# Patient Record
Sex: Male | Born: 1995 | Race: White | Hispanic: No | Marital: Single | Smoking: Never smoker
Health system: Southern US, Community
[De-identification: ages and names within clinical notes are randomized; demographics above are authoritative.]

## PROBLEM LIST (undated history)

## (undated) DIAGNOSIS — R142 Eructation: Secondary | ICD-10-CM

## (undated) DIAGNOSIS — Z789 Other specified health status: Secondary | ICD-10-CM

## (undated) DIAGNOSIS — K219 Gastro-esophageal reflux disease without esophagitis: Secondary | ICD-10-CM

---
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
## 2015-03-13 ENCOUNTER — Encounter (HOSPITAL_COMMUNITY): Payer: Self-pay | Admitting: *Deleted

## 2015-03-13 ENCOUNTER — Emergency Department (INDEPENDENT_AMBULATORY_CARE_PROVIDER_SITE_OTHER)
Admission: EM | Admit: 2015-03-13 | Discharge: 2015-03-13 | Disposition: A | Discharge status: Home / Self Care | Payer: Medicaid Other | Source: Home / Self Care | Attending: Family Medicine | Admitting: Family Medicine

## 2015-03-13 ENCOUNTER — Emergency Department (HOSPITAL_COMMUNITY): Payer: Medicaid Other

## 2015-03-13 ENCOUNTER — Encounter (HOSPITAL_COMMUNITY): Payer: Self-pay | Admitting: Family Medicine

## 2015-03-13 ENCOUNTER — Emergency Department (HOSPITAL_COMMUNITY)
Admission: EM | Admit: 2015-03-13 | Discharge: 2015-03-13 | Disposition: A | Discharge status: Home / Self Care | Payer: Medicaid Other | Attending: Emergency Medicine | Admitting: Emergency Medicine

## 2015-03-13 DIAGNOSIS — Z79899 Other long term (current) drug therapy: Secondary | ICD-10-CM | POA: Diagnosis not present

## 2015-03-13 DIAGNOSIS — Z23 Encounter for immunization: Secondary | ICD-10-CM

## 2015-03-13 DIAGNOSIS — Z7951 Long term (current) use of inhaled steroids: Secondary | ICD-10-CM | POA: Diagnosis not present

## 2015-03-13 DIAGNOSIS — Z792 Long term (current) use of antibiotics: Secondary | ICD-10-CM | POA: Diagnosis not present

## 2015-03-13 DIAGNOSIS — S3660XA Unspecified injury of rectum, initial encounter: Secondary | ICD-10-CM | POA: Diagnosis present

## 2015-03-13 DIAGNOSIS — T148 Other injury of unspecified body region: Secondary | ICD-10-CM

## 2015-03-13 DIAGNOSIS — Y92513 Shop (commercial) as the place of occurrence of the external cause: Secondary | ICD-10-CM | POA: Diagnosis not present

## 2015-03-13 DIAGNOSIS — Y9389 Activity, other specified: Secondary | ICD-10-CM | POA: Insufficient documentation

## 2015-03-13 DIAGNOSIS — T148XXA Other injury of unspecified body region, initial encounter: Secondary | ICD-10-CM

## 2015-03-13 DIAGNOSIS — S31531A Puncture wound without foreign body of unspecified external genital organs, male, initial encounter: Secondary | ICD-10-CM | POA: Diagnosis not present

## 2015-03-13 DIAGNOSIS — IMO0001 Reserved for inherently not codable concepts without codable children: Secondary | ICD-10-CM

## 2015-03-13 DIAGNOSIS — Y999 Unspecified external cause status: Secondary | ICD-10-CM | POA: Insufficient documentation

## 2015-03-13 DIAGNOSIS — W2209XA Striking against other stationary object, initial encounter: Secondary | ICD-10-CM | POA: Diagnosis not present

## 2015-03-13 DIAGNOSIS — S31000A Unspecified open wound of lower back and pelvis without penetration into retroperitoneum, initial encounter: Secondary | ICD-10-CM

## 2015-03-13 LAB — URINALYSIS, ROUTINE W REFLEX MICROSCOPIC
Bilirubin Urine: NEGATIVE
GLUCOSE, UA: NEGATIVE mg/dL
Hgb urine dipstick: NEGATIVE
Ketones, ur: NEGATIVE mg/dL
LEUKOCYTES UA: NEGATIVE
NITRITE: NEGATIVE
Protein, ur: NEGATIVE mg/dL
Specific Gravity, Urine: 1.046 — ABNORMAL HIGH (ref 1.005–1.030)
UROBILINOGEN UA: 0.2 mg/dL (ref 0.0–1.0)
pH: 7 (ref 5.0–8.0)

## 2015-03-13 LAB — I-STAT CHEM 8, ED
BUN: 18 mg/dL (ref 6–20)
Calcium, Ion: 1.27 mmol/L — ABNORMAL HIGH (ref 1.12–1.23)
Chloride: 103 mmol/L (ref 101–111)
Creatinine, Ser: 0.9 mg/dL (ref 0.61–1.24)
Glucose, Bld: 101 mg/dL — ABNORMAL HIGH (ref 65–99)
HCT: 45 % (ref 39.0–52.0)
Hemoglobin: 15.3 g/dL (ref 13.0–17.0)
Potassium: 4.1 mmol/L (ref 3.5–5.1)
Sodium: 140 mmol/L (ref 135–145)
TCO2: 23 mmol/L (ref 0–100)

## 2015-03-13 LAB — CBC WITH DIFFERENTIAL/PLATELET
BASOS ABS: 0 10*3/uL (ref 0.0–0.1)
BASOS PCT: 0 % (ref 0–1)
EOS ABS: 0.1 10*3/uL (ref 0.0–0.7)
EOS PCT: 0 % (ref 0–5)
HEMATOCRIT: 42.5 % (ref 39.0–52.0)
HEMOGLOBIN: 14.4 g/dL (ref 13.0–17.0)
LYMPHS PCT: 12 % (ref 12–46)
Lymphs Abs: 1.5 10*3/uL (ref 0.7–4.0)
MCH: 28.7 pg (ref 26.0–34.0)
MCHC: 33.9 g/dL (ref 30.0–36.0)
MCV: 84.7 fL (ref 78.0–100.0)
MONOS PCT: 6 % (ref 3–12)
Monocytes Absolute: 0.7 10*3/uL (ref 0.1–1.0)
NEUTROS ABS: 10.5 10*3/uL — AB (ref 1.7–7.7)
Neutrophils Relative %: 82 % — ABNORMAL HIGH (ref 43–77)
Platelets: 141 10*3/uL — ABNORMAL LOW (ref 150–400)
RBC: 5.02 MIL/uL (ref 4.22–5.81)
RDW: 13 % (ref 11.5–15.5)
WBC: 12.7 10*3/uL — AB (ref 4.0–10.5)

## 2015-03-13 MED ORDER — TETANUS-DIPHTH-ACELL PERTUSSIS 5-2.5-18.5 LF-MCG/0.5 IM SUSP
INTRAMUSCULAR | Status: AC
  Filled 2015-03-13: qty 0.5

## 2015-03-13 MED ORDER — TETANUS-DIPHTH-ACELL PERTUSSIS 5-2.5-18.5 LF-MCG/0.5 IM SUSP: 0.5000 mL | Freq: Once | INTRAMUSCULAR | Status: DC

## 2015-03-13 MED ORDER — IOHEXOL 300 MG/ML  SOLN
100.0000 mL | Freq: Once | INTRAMUSCULAR | Status: AC | PRN
  Administered 2015-03-13: 100 mL via INTRAVENOUS

## 2015-03-13 NOTE — ED Notes (Signed)
Pt fell and now he is bleeding from rectum

## 2015-03-13 NOTE — ED Notes (Signed)
Patient transported to CT 

## 2015-03-13 NOTE — ED Provider Notes (Signed)
CSN: [id number]     Arrival date & time 03/13/15  1752 History   First MD Initiated Contact with Patient 03/13/15 1810     Chief Complaint  Patient presents with  . Puncture Wound     (Consider location/radiation/quality/duration/timing/severity/associated sxs/prior Treatment) The history is provided by the patient and a relative.   patient was sent from urgent care after a puncture wound in his rectal area. Reportedly was resting with the brother and landed on a bucket of grease guns. One punctured through his pants and hit him in his rectal area. He still has some bleeding. Unknown if any grease had come out. They state that the end of the gun was metal and was covered in blood. Patient otherwise feels fine. Abdomen soft nontender. No lightheadedness or dizziness. No fevers.  History reviewed. No pertinent past medical history. History reviewed. No pertinent past surgical history. Family History  Problem Relation Age of Onset  . Diabetes      MGGF  . Other      heart problem- PGGM   History  Substance Use Topics  . Smoking status: Never Smoker   . Smokeless tobacco: Not on file  . Alcohol Use: No    Review of Systems  Constitutional: Negative for activity change and appetite change.  Eyes: Negative for pain.  Respiratory: Negative for chest tightness and shortness of breath.   Cardiovascular: Negative for chest pain and leg swelling.  Gastrointestinal: Negative for nausea, vomiting, abdominal pain and diarrhea.  Genitourinary: Negative for flank pain.  Musculoskeletal: Negative for back pain and neck stiffness.  Skin: Positive for wound. Negative for rash.  Neurological: Negative for weakness, numbness and headaches.  Psychiatric/Behavioral: Negative for behavioral problems.      Allergies  Review of patient's allergies indicates no known allergies.  Home Medications   Prior to Admission medications   Medication Sig Start Date End Date Taking? Authorizing Provider    Cetirizine HCl (ZYRTEC ALLERGY PO) Take 1 tablet by mouth daily as needed.      Historical Provider, MD  SALINE NASAL SPRAY NA Place into the nose as needed.    Historical Provider, MD   BP 127/80 mmHg  Pulse 76  Temp(Src) 98.2 F (36.8 C) (Oral)  Resp 15  Ht 6\' 2"  (1.88 m)  Wt 220 lb (99.791 kg)  BMI 28.23 kg/m2  SpO2 100% Physical Exam  Constitutional: He appears well-developed.  HENT:  Head: Normocephalic.  Cardiovascular: Normal rate and regular rhythm.   Pulmonary/Chest: Effort normal.  Abdominal: There is no tenderness.  Genitourinary:     Puncture wound in the perianal area. At approximately 3:00 in the dorsal lithotomy position. Small amount of active bleeding.  Neurological: He is alert.  Skin: Skin is warm.    ED Course  Procedures (including critical care time) Labs Review Labs Reviewed  CBC WITH DIFFERENTIAL/PLATELET - Abnormal; Notable for the following:    WBC 12.7 (*)    Platelets 141 (*)    Neutrophils Relative % 82 (*)    Neutro Abs 10.5 (*)    All other components within normal limits  URINALYSIS, ROUTINE W REFLEX MICROSCOPIC - Abnormal; Notable for the following:    Specific Gravity, Urine 1.046 (*)    All other components within normal limits  I-STAT CHEM 8, ED - Abnormal; Notable for the following:    Glucose, Bld 101 (*)    Calcium, Ion 1.27 (*)    All other components within normal limits    Imaging  Review Ct Abdomen Pelvis W Contrast  03/13/2015   CLINICAL DATA:  Fell onto bucket of grease guns while playing with brother. Puncture wound at the anus. Initial encounter.  EXAM: CT ABDOMEN AND PELVIS WITH CONTRAST  TECHNIQUE: Multidetector CT imaging of the abdomen and pelvis was performed using the standard protocol following bolus administration of intravenous contrast.  CONTRAST:  100mL OMNIPAQUE IOHEXOL 300 MG/ML  SOLN  COMPARISON:  None.  FINDINGS: The visualized lung bases are clear.  The liver and spleen are unremarkable in appearance. The  gallbladder is within normal limits. The pancreas and adrenal glands are unremarkable.  The kidneys are unremarkable in appearance. There is no evidence of hydronephrosis. No renal or ureteral stones are seen. No perinephric stranding is appreciated.  No free fluid is identified. The small bowel is unremarkable in appearance. The stomach is within normal limits. No acute vascular abnormalities are seen.  The appendix is normal in caliber and contains air, without evidence of appendicitis. The colon is unremarkable in appearance. The anorectal canal is within normal limits.  There appears to be a puncture wound at the left side of the anus, with minimal air tracking about the base of the prostate, anterior to the prostate, and anterior and to the left of the bladder. Mild associated soft tissue stranding is noted about the left anterior aspect of the bladder, within the space of Retzius. This most likely reflects extension of the injury adjacent to the bladder. There is no evidence of bladder perforation or significant hematoma. Would correlate with clinical symptoms to exclude prostatic urethral injury. The underlying bowel is unremarkable in appearance.  The bladder is otherwise grossly unremarkable in appearance. There is likely mild injury to the anterior aspect of the prostate. No inguinal lymphadenopathy is seen.  No acute osseous abnormalities are identified.  IMPRESSION: Puncture wound at the left side of the anus, with minimal air tracking about the base of the prostate, anterior to the prostate, and anterior and to the left of the bladder. Mild associated soft tissue stranding about the left anterior aspect of the bladder, within the space of Retzius. This most likely reflects extension of the injury adjacent to the bladder. No evidence of bladder perforation or significant hematoma. Would correlate with clinical symptoms to exclude prostatic urethral injury. Underlying bowel is unremarkable in appearance.    Electronically Signed   By: Roanna RaiderJeffery  Chang M.D.   On: 03/13/2015 20:30     EKG Interpretation None      MDM   Final diagnoses:  Puncture wound of perineum, initial encounter    Patient apparently a puncture wound. CT scan done and appears this mostly important structures. Discussed with Dr. Andrey CampanileWilson from general surgery and Dr. Isabel CapriceGrapey from urology. No blood at the meatus or blood in the urine. Will discharge home with instructions to keep the wound clean. Will return for fevers increasing abdominal pain or blood in the urine.    Benjiman CoreNathan Hommer Cunliffe, MD 03/14/15 [phone]

## 2015-03-13 NOTE — Discharge Instructions (Signed)
Please go to the emergency room for further evaluation. There is concern that you may have perforated your colon or damaged your anal sphincter.

## 2015-03-13 NOTE — ED Notes (Signed)
Pt was playing with brother in dad's shop and fell onto a bucket of grease guns. Pt reports puncture wound to his anus. Bleeding is not controlled. Pt was given a pair of blue scrub pants by nurse first.
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
## 2015-03-13 NOTE — Discharge Instructions (Signed)
Return for fevers, abdominal pain, or blood in the urine. Attempt to keep the area clean. Warm soaks 3 times a day may help.  Puncture Wound A puncture wound is an injury that extends through all layers of the skin and into the tissue beneath the skin (subcutaneous tissue). Puncture wounds become infected easily because germs often enter the body and go beneath the skin during the injury. Having a deep wound with a small entrance point makes it difficult for your caregiver to adequately clean the wound. This is especially true if you have stepped on a nail and it has passed through a dirty shoe or other situations where the wound is obviously contaminated. CAUSES  Many puncture wounds involve glass, nails, splinters, fish hooks, or other objects that enter the skin (foreign bodies). A puncture wound may also be caused by a human bite or animal bite. DIAGNOSIS  A puncture wound is usually diagnosed by your history and a physical exam. You may need to have an X-ray or an ultrasound to check for any foreign bodies still in the wound. TREATMENT   Your caregiver will clean the wound as thoroughly as possible. Depending on the location of the wound, a bandage (dressing) may be applied.  Your caregiver might prescribe antibiotic medicines.  You may need a follow-up visit to check on your wound. Follow all instructions as directed by your caregiver. HOME CARE INSTRUCTIONS   Change your dressing once per day, or as directed by your caregiver. If the dressing sticks, it may be removed by soaking the area in water.  If your caregiver has given you follow-up instructions, it is very important that you return for a follow-up appointment. Not following up as directed could result in a chronic or permanent injury, pain, and disability.  Only take over-the-counter or prescription medicines for pain, discomfort, or fever as directed by your caregiver.  If you are given antibiotics, take them as directed.  Finish them even if you start to feel better. You may need a tetanus shot if:  You cannot remember when you had your last tetanus shot.  You have never had a tetanus shot. If you got a tetanus shot, your arm may swell, get red, and feel warm to the touch. This is common and not a problem. If you need a tetanus shot and you choose not to have one, there is a rare chance of getting tetanus. Sickness from tetanus can be serious. You may need a rabies shot if an animal bite caused your puncture wound. SEEK MEDICAL CARE IF:   You have redness, swelling, or increasing pain in the wound.  You have red streaks going away from the wound.  You notice a bad smell coming from the wound or dressing.  You have yellowish-white fluid (pus) coming from the wound.  You are treated with an antibiotic for infection, but the infection is not getting better.  You notice something in the wound, such as rubber from your shoe, cloth, or another object.  You have a fever.  You have severe pain.  You have difficulty breathing.  You feel dizzy or faint.  You cannot stop vomiting.  You lose feeling, develop numbness, or cannot move a limb below the wound.  Your symptoms worsen. MAKE SURE YOU:  Understand these instructions.  Will watch your condition.  Will get help right away if you are not doing well or get worse. Document Released: 07/25/2005 Document Revised: 01/07/2012 Document Reviewed: 04/03/2011 ExitCare Patient Information 2015  [employer]. This information is not intended to replace advice given to you by your health care provider. Make sure you discuss any questions you have with your health care provider.
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
## 2015-03-16 ENCOUNTER — Emergency Department (INDEPENDENT_AMBULATORY_CARE_PROVIDER_SITE_OTHER)
Admission: EM | Admit: 2015-03-16 | Discharge: 2015-03-16 | Disposition: A | Discharge status: Home / Self Care | Payer: Medicaid Other | Source: Home / Self Care | Attending: Family Medicine | Admitting: Family Medicine

## 2015-03-16 ENCOUNTER — Encounter (HOSPITAL_COMMUNITY): Payer: Self-pay | Admitting: Emergency Medicine

## 2015-03-16 DIAGNOSIS — S31823D Puncture wound without foreign body of left buttock, subsequent encounter: Secondary | ICD-10-CM

## 2015-03-16 DIAGNOSIS — R509 Fever, unspecified: Secondary | ICD-10-CM

## 2015-03-16 LAB — POCT URINALYSIS DIP (DEVICE)
GLUCOSE, UA: NEGATIVE mg/dL
HGB URINE DIPSTICK: NEGATIVE
KETONES UR: NEGATIVE mg/dL
Leukocytes, UA: NEGATIVE
Nitrite: NEGATIVE
PH: 6 (ref 5.0–8.0)
Protein, ur: 100 mg/dL — AB
Urobilinogen, UA: 2 mg/dL — ABNORMAL HIGH (ref 0.0–1.0)

## 2015-03-16 MED ORDER — CEPHALEXIN 500 MG PO CAPS: 500.0000 mg | ORAL_CAPSULE | Freq: Four times a day (QID) | ORAL | Status: DC
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
## 2015-03-18 ENCOUNTER — Emergency Department (HOSPITAL_COMMUNITY): Payer: Medicaid Other

## 2015-03-18 ENCOUNTER — Inpatient Hospital Stay (HOSPITAL_COMMUNITY): Payer: Medicaid Other | Admitting: Anesthesiology

## 2015-03-18 ENCOUNTER — Encounter (HOSPITAL_COMMUNITY): Payer: Self-pay | Admitting: Emergency Medicine

## 2015-03-18 ENCOUNTER — Encounter (HOSPITAL_COMMUNITY): Admission: EM | Disposition: A | Discharge status: Home / Self Care | Payer: Self-pay | Source: Home / Self Care

## 2015-03-18 ENCOUNTER — Inpatient Hospital Stay (HOSPITAL_COMMUNITY)
Admission: EM | Admit: 2015-03-18 | Discharge: 2015-03-20 | DRG: 371 | Disposition: A | Discharge status: Home / Self Care | Payer: Medicaid Other | Attending: General Surgery | Admitting: General Surgery

## 2015-03-18 DIAGNOSIS — K651 Peritoneal abscess: Secondary | ICD-10-CM | POA: Diagnosis present

## 2015-03-18 DIAGNOSIS — A419 Sepsis, unspecified organism: Secondary | ICD-10-CM

## 2015-03-18 DIAGNOSIS — IMO0002 Reserved for concepts with insufficient information to code with codable children: Secondary | ICD-10-CM

## 2015-03-18 DIAGNOSIS — B952 Enterococcus as the cause of diseases classified elsewhere: Secondary | ICD-10-CM | POA: Diagnosis present

## 2015-03-18 DIAGNOSIS — S31031A Puncture wound without foreign body of lower back and pelvis with penetration into retroperitoneum, initial encounter: Secondary | ICD-10-CM | POA: Diagnosis present

## 2015-03-18 DIAGNOSIS — W3189XA Contact with other specified machinery, initial encounter: Secondary | ICD-10-CM | POA: Diagnosis not present

## 2015-03-18 HISTORY — DX: Eructation: R14.2

## 2015-03-18 HISTORY — PX: INCISION AND DRAINAGE PERIRECTAL ABSCESS: SHX1804

## 2015-03-18 HISTORY — DX: Other specified health status: Z78.9

## 2015-03-18 HISTORY — PX: TRANSRECTAL DRAINAGE OF PELVIC ABSCESS: SUR1387

## 2015-03-18 LAB — CBC WITH DIFFERENTIAL/PLATELET
BASOS ABS: 0 10*3/uL (ref 0.0–0.1)
Basophils Relative: 0 % (ref 0–1)
EOS PCT: 0 % (ref 0–5)
Eosinophils Absolute: 0 10*3/uL (ref 0.0–0.7)
HEMATOCRIT: 37.9 % — AB (ref 39.0–52.0)
Hemoglobin: 12.9 g/dL — ABNORMAL LOW (ref 13.0–17.0)
LYMPHS ABS: 0.9 10*3/uL (ref 0.7–4.0)
LYMPHS PCT: 12 % (ref 12–46)
MCH: 28.8 pg (ref 26.0–34.0)
MCHC: 34 g/dL (ref 30.0–36.0)
MCV: 84.6 fL (ref 78.0–100.0)
Monocytes Absolute: 0.4 10*3/uL (ref 0.1–1.0)
Monocytes Relative: 5 % (ref 3–12)
NEUTROS ABS: 6.2 10*3/uL (ref 1.7–7.7)
Neutrophils Relative %: 83 % — ABNORMAL HIGH (ref 43–77)
Platelets: 145 10*3/uL — ABNORMAL LOW (ref 150–400)
RBC: 4.48 MIL/uL (ref 4.22–5.81)
RDW: 12.7 % (ref 11.5–15.5)
WBC: 7.6 10*3/uL (ref 4.0–10.5)

## 2015-03-18 LAB — URINALYSIS, ROUTINE W REFLEX MICROSCOPIC
Glucose, UA: NEGATIVE mg/dL
HGB URINE DIPSTICK: NEGATIVE
Ketones, ur: 15 mg/dL — AB
Leukocytes, UA: NEGATIVE
NITRITE: NEGATIVE
Protein, ur: 100 mg/dL — AB
SPECIFIC GRAVITY, URINE: 1.022 (ref 1.005–1.030)
UROBILINOGEN UA: 2 mg/dL — AB (ref 0.0–1.0)
pH: 5.5 (ref 5.0–8.0)

## 2015-03-18 LAB — I-STAT CG4 LACTIC ACID, ED
Lactic Acid, Venous: 3.68 mmol/L (ref 0.5–2.0)
Lactic Acid, Venous: 1.14 mmol/L (ref 0.5–2.0)

## 2015-03-18 LAB — COMPREHENSIVE METABOLIC PANEL
ALBUMIN: 3.1 g/dL — AB (ref 3.5–5.0)
ALT: 41 U/L (ref 17–63)
AST: 38 U/L (ref 15–41)
Alkaline Phosphatase: 102 U/L (ref 38–126)
Anion gap: 12 (ref 5–15)
BILIRUBIN TOTAL: 1.4 mg/dL — AB (ref 0.3–1.2)
BUN: 16 mg/dL (ref 6–20)
CHLORIDE: 107 mmol/L (ref 101–111)
CO2: 18 mmol/L — ABNORMAL LOW (ref 22–32)
Calcium: 8.9 mg/dL (ref 8.9–10.3)
Creatinine, Ser: 1.4 mg/dL — ABNORMAL HIGH (ref 0.61–1.24)
GFR calc Af Amer: >60 mL/min (ref >60)
GFR calc non Af Amer: >60 mL/min (ref >60)
Glucose, Bld: 131 mg/dL — ABNORMAL HIGH (ref 65–99)
POTASSIUM: 3.7 mmol/L (ref 3.5–5.1)
SODIUM: 137 mmol/L (ref 135–145)
TOTAL PROTEIN: 6.4 g/dL — AB (ref 6.5–8.1)

## 2015-03-18 LAB — URINE MICROSCOPIC-ADD ON

## 2015-03-18 LAB — I-STAT CHEM 8, ED
BUN: 17 mg/dL (ref 6–20)
CALCIUM ION: 1.16 mmol/L (ref 1.12–1.23)
CHLORIDE: 109 mmol/L (ref 101–111)
Creatinine, Ser: 1.3 mg/dL — ABNORMAL HIGH (ref 0.61–1.24)
GLUCOSE: 135 mg/dL — AB (ref 65–99)
HCT: 39 % (ref 39.0–52.0)
Hemoglobin: 13.3 g/dL (ref 13.0–17.0)
Potassium: 3.9 mmol/L (ref 3.5–5.1)
Sodium: 141 mmol/L (ref 135–145)
TCO2: 17 mmol/L (ref 0–100)

## 2015-03-18 SURGERY — INCISION AND DRAINAGE, ABSCESS, PERIRECTAL
Anesthesia: General

## 2015-03-18 MED ORDER — GLYCOPYRROLATE 0.2 MG/ML IJ SOLN
INTRAMUSCULAR | Status: DC | PRN
  Administered 2015-03-18: .8 mg via INTRAVENOUS

## 2015-03-18 MED ORDER — HYDROCODONE-ACETAMINOPHEN 10-325 MG PO TABS
1.0000 | ORAL_TABLET | ORAL | Status: DC | PRN
  Administered 2015-03-20: 1 via ORAL
  Filled 2015-03-18: qty 1

## 2015-03-18 MED ORDER — PIPERACILLIN-TAZOBACTAM 3.375 G IVPB
3.3750 g | Freq: Three times a day (TID) | INTRAVENOUS | Status: DC
  Administered 2015-03-18 – 2015-03-20 (×6): 3.375 g via INTRAVENOUS
  Filled 2015-03-18 (×8): qty 50

## 2015-03-18 MED ORDER — FENTANYL CITRATE (PF) 100 MCG/2ML IJ SOLN
INTRAMUSCULAR | Status: DC | PRN
  Administered 2015-03-18 (×2): 50 ug via INTRAVENOUS
  Administered 2015-03-18: 100 ug via INTRAVENOUS

## 2015-03-18 MED ORDER — ONDANSETRON HCL 4 MG/2ML IJ SOLN
4.0000 mg | Freq: Once | INTRAMUSCULAR | Status: AC
  Administered 2015-03-18: 4 mg via INTRAVENOUS

## 2015-03-18 MED ORDER — MORPHINE SULFATE 2 MG/ML IJ SOLN: 2.0000 mg | INTRAMUSCULAR | Status: DC | PRN

## 2015-03-18 MED ORDER — POTASSIUM CHLORIDE IN NACL 20-0.45 MEQ/L-% IV SOLN
INTRAVENOUS | Status: DC
  Administered 2015-03-18 – 2015-03-19 (×2): via INTRAVENOUS
  Filled 2015-03-18 (×3): qty 1000

## 2015-03-18 MED ORDER — IOHEXOL 300 MG/ML  SOLN
100.0000 mL | Freq: Once | INTRAMUSCULAR | Status: AC | PRN
  Administered 2015-03-18: 100 mL via INTRAVENOUS

## 2015-03-18 MED ORDER — ENOXAPARIN SODIUM 30 MG/0.3ML ~~LOC~~ SOLN
30.0000 mg | Freq: Two times a day (BID) | SUBCUTANEOUS | Status: DC
  Administered 2015-03-18 – 2015-03-20 (×4): 30 mg via SUBCUTANEOUS
  Filled 2015-03-18 (×4): qty 0.3

## 2015-03-18 MED ORDER — MIDAZOLAM HCL 5 MG/5ML IJ SOLN
INTRAMUSCULAR | Status: DC | PRN
  Administered 2015-03-18 (×2): 1 mg via INTRAVENOUS

## 2015-03-18 MED ORDER — MIDAZOLAM HCL 2 MG/2ML IJ SOLN
INTRAMUSCULAR | Status: AC
  Filled 2015-03-18: qty 2

## 2015-03-18 MED ORDER — ONDANSETRON HCL 4 MG/2ML IJ SOLN
INTRAMUSCULAR | Status: AC
  Filled 2015-03-18: qty 2

## 2015-03-18 MED ORDER — DEXAMETHASONE SODIUM PHOSPHATE 4 MG/ML IJ SOLN
INTRAMUSCULAR | Status: AC
  Filled 2015-03-18: qty 2

## 2015-03-18 MED ORDER — ALBUMIN HUMAN 5 % IV SOLN
INTRAVENOUS | Status: DC | PRN
  Administered 2015-03-18: 13:00:00 via INTRAVENOUS

## 2015-03-18 MED ORDER — FENTANYL CITRATE (PF) 100 MCG/2ML IJ SOLN
100.0000 ug | Freq: Once | INTRAMUSCULAR | Status: AC
  Administered 2015-03-18: 100 ug via INTRAVENOUS
  Filled 2015-03-18: qty 2

## 2015-03-18 MED ORDER — LIDOCAINE HCL (CARDIAC) 20 MG/ML IV SOLN
INTRAVENOUS | Status: AC
  Filled 2015-03-18: qty 5

## 2015-03-18 MED ORDER — FENTANYL CITRATE (PF) 250 MCG/5ML IJ SOLN
INTRAMUSCULAR | Status: AC
  Filled 2015-03-18: qty 5

## 2015-03-18 MED ORDER — SODIUM CHLORIDE 0.9 % IV BOLUS (SEPSIS): 1000.0000 mL | Freq: Once | INTRAVENOUS | Status: DC

## 2015-03-18 MED ORDER — VANCOMYCIN HCL IN DEXTROSE 1-5 GM/200ML-% IV SOLN
1000.0000 mg | Freq: Once | INTRAVENOUS | Status: AC
  Administered 2015-03-18: 1000 mg via INTRAVENOUS
  Filled 2015-03-18: qty 200

## 2015-03-18 MED ORDER — HYDROMORPHONE HCL 1 MG/ML IJ SOLN
INTRAMUSCULAR | Status: AC
  Administered 2015-03-18: 0.5 mg
  Filled 2015-03-18: qty 1

## 2015-03-18 MED ORDER — ROCURONIUM BROMIDE 50 MG/5ML IV SOLN
INTRAVENOUS | Status: AC
  Filled 2015-03-18: qty 1

## 2015-03-18 MED ORDER — ONDANSETRON HCL 4 MG PO TABS: 4.0000 mg | ORAL_TABLET | Freq: Four times a day (QID) | ORAL | Status: DC | PRN

## 2015-03-18 MED ORDER — SODIUM CHLORIDE 0.9 % IV BOLUS (SEPSIS)
1000.0000 mL | Freq: Once | INTRAVENOUS | Status: AC
  Administered 2015-03-18: 1000 mL via INTRAVENOUS

## 2015-03-18 MED ORDER — LIDOCAINE HCL (CARDIAC) 20 MG/ML IV SOLN
INTRAVENOUS | Status: DC | PRN
  Administered 2015-03-18: 100 mg via INTRAVENOUS

## 2015-03-18 MED ORDER — ROCURONIUM BROMIDE 100 MG/10ML IV SOLN
INTRAVENOUS | Status: DC | PRN
  Administered 2015-03-18: 50 mg via INTRAVENOUS

## 2015-03-18 MED ORDER — IBUPROFEN 800 MG PO TABS
800.0000 mg | ORAL_TABLET | Freq: Three times a day (TID) | ORAL | Status: DC
  Administered 2015-03-18 – 2015-03-19 (×5): 800 mg via ORAL
  Filled 2015-03-18 (×5): qty 1

## 2015-03-18 MED ORDER — DEXAMETHASONE SODIUM PHOSPHATE 4 MG/ML IJ SOLN
INTRAMUSCULAR | Status: DC | PRN
  Administered 2015-03-18: 8 mg via INTRAVENOUS

## 2015-03-18 MED ORDER — CETIRIZINE HCL 10 MG PO TABS
10.0000 mg | ORAL_TABLET | Freq: Every day | ORAL | Status: DC | PRN
  Filled 2015-03-18: qty 1

## 2015-03-18 MED ORDER — ACETAMINOPHEN 10 MG/ML IV SOLN
INTRAVENOUS | Status: DC | PRN
  Administered 2015-03-18: 1000 mg via INTRAVENOUS

## 2015-03-18 MED ORDER — NEOSTIGMINE METHYLSULFATE 10 MG/10ML IV SOLN
INTRAVENOUS | Status: DC | PRN
  Administered 2015-03-18: 5 mg via INTRAVENOUS

## 2015-03-18 MED ORDER — PIPERACILLIN-TAZOBACTAM 3.375 G IVPB 30 MIN
3.3750 g | Freq: Once | INTRAVENOUS | Status: AC
  Administered 2015-03-18: 3.375 g via INTRAVENOUS
  Filled 2015-03-18: qty 50

## 2015-03-18 MED ORDER — LACTATED RINGERS IV SOLN
INTRAVENOUS | Status: DC
  Administered 2015-03-18: 12:00:00 via INTRAVENOUS

## 2015-03-18 MED ORDER — PROPOFOL 10 MG/ML IV BOLUS
INTRAVENOUS | Status: DC | PRN
  Administered 2015-03-18: 200 mg via INTRAVENOUS

## 2015-03-18 MED ORDER — SUCCINYLCHOLINE CHLORIDE 20 MG/ML IJ SOLN
INTRAMUSCULAR | Status: DC | PRN
  Administered 2015-03-18: 100 mg via INTRAVENOUS

## 2015-03-18 MED ORDER — ACETAMINOPHEN 10 MG/ML IV SOLN
INTRAVENOUS | Status: AC
  Filled 2015-03-18: qty 100

## 2015-03-18 MED ORDER — ONDANSETRON HCL 4 MG/2ML IJ SOLN: 4.0000 mg | Freq: Four times a day (QID) | INTRAMUSCULAR | Status: DC | PRN

## 2015-03-18 MED ORDER — PROPOFOL 10 MG/ML IV BOLUS
INTRAVENOUS | Status: AC
  Filled 2015-03-18: qty 20

## 2015-03-18 MED ORDER — SALINE SPRAY 0.65 % NA SOLN
2.0000 | NASAL | Status: DC | PRN
  Filled 2015-03-18: qty 44

## 2015-03-18 MED ORDER — ONDANSETRON HCL 4 MG/2ML IJ SOLN
4.0000 mg | Freq: Once | INTRAMUSCULAR | Status: DC
  Filled 2015-03-18: qty 2

## 2015-03-18 MED ORDER — ACETAMINOPHEN 325 MG PO TABS
650.0000 mg | ORAL_TABLET | Freq: Once | ORAL | Status: AC
  Administered 2015-03-18: 650 mg via ORAL
  Filled 2015-03-18: qty 2

## 2015-03-18 MED ORDER — ONDANSETRON HCL 4 MG/2ML IJ SOLN
INTRAMUSCULAR | Status: DC | PRN
  Administered 2015-03-18: 4 mg via INTRAVENOUS

## 2015-03-18 MED ORDER — POLYETHYLENE GLYCOL 3350 17 G PO PACK
17.0000 g | PACK | Freq: Every day | ORAL | Status: DC
  Administered 2015-03-18 – 2015-03-19 (×2): 17 g via ORAL
  Filled 2015-03-18 (×2): qty 1

## 2015-03-18 MED ORDER — HYDROMORPHONE HCL 1 MG/ML IJ SOLN
0.2500 mg | INTRAMUSCULAR | Status: DC | PRN
  Administered 2015-03-18: 0.5 mg via INTRAVENOUS

## 2015-03-18 MED ORDER — VANCOMYCIN HCL IN DEXTROSE 1-5 GM/200ML-% IV SOLN
1000.0000 mg | Freq: Three times a day (TID) | INTRAVENOUS | Status: DC
  Administered 2015-03-18 – 2015-03-20 (×5): 1000 mg via INTRAVENOUS
  Filled 2015-03-18 (×7): qty 200

## 2015-03-18 MED ORDER — DOCUSATE SODIUM 100 MG PO CAPS
100.0000 mg | ORAL_CAPSULE | Freq: Two times a day (BID) | ORAL | Status: DC
  Administered 2015-03-18 – 2015-03-19 (×4): 100 mg via ORAL
  Filled 2015-03-18 (×4): qty 1

## 2015-03-18 SURGICAL SUPPLY — 32 items
CANISTER SUCTION 2500CC (MISCELLANEOUS) ×3 IMPLANT
CLEANER TIP ELECTROSURG 2X2 (MISCELLANEOUS) ×3 IMPLANT
COVER SURGICAL LIGHT HANDLE (MISCELLANEOUS) ×3 IMPLANT
DRAIN PENROSE 1/2X12 LTX STRL (WOUND CARE) ×2 IMPLANT
DRSG PAD ABDOMINAL 8X10 ST (GAUZE/BANDAGES/DRESSINGS) ×3 IMPLANT
ELECT REM PT RETURN 9FT ADLT (ELECTROSURGICAL) ×3
ELECTRODE REM PT RTRN 9FT ADLT (ELECTROSURGICAL) ×1 IMPLANT
GAUZE PACKING IODOFORM 1 (PACKING) IMPLANT
GAUZE SPONGE 4X4 12PLY STRL (GAUZE/BANDAGES/DRESSINGS) ×5 IMPLANT
GAUZE SPONGE 4X4 16PLY XRAY LF (GAUZE/BANDAGES/DRESSINGS) ×3 IMPLANT
GLOVE BIOGEL PI IND STRL 8 (GLOVE) ×1 IMPLANT
GLOVE BIOGEL PI INDICATOR 8 (GLOVE) ×2
GLOVE ECLIPSE 7.5 STRL STRAW (GLOVE) ×3 IMPLANT
GOWN STRL REUS W/ TWL LRG LVL3 (GOWN DISPOSABLE) ×2 IMPLANT
GOWN STRL REUS W/TWL LRG LVL3 (GOWN DISPOSABLE) ×6
KIT BASIN OR (CUSTOM PROCEDURE TRAY) ×3 IMPLANT
KIT ROOM TURNOVER OR (KITS) ×3 IMPLANT
NS IRRIG 1000ML POUR BTL (IV SOLUTION) ×3 IMPLANT
PACK LITHOTOMY IV (CUSTOM PROCEDURE TRAY) ×3 IMPLANT
PAD ARMBOARD 7.5X6 YLW CONV (MISCELLANEOUS) ×3 IMPLANT
PENCIL BUTTON HOLSTER BLD 10FT (ELECTRODE) ×3 IMPLANT
SPONGE GAUZE 4X4 12PLY STER LF (GAUZE/BANDAGES/DRESSINGS) ×2 IMPLANT
SURGILUBE 2OZ TUBE FLIPTOP (MISCELLANEOUS) ×3 IMPLANT
SWAB COLLECTION DEVICE MRSA (MISCELLANEOUS) IMPLANT
TOWEL OR 17X24 6PK STRL BLUE (TOWEL DISPOSABLE) ×3 IMPLANT
TOWEL OR 17X26 10 PK STRL BLUE (TOWEL DISPOSABLE) ×3 IMPLANT
TUBE ANAEROBIC SPECIMEN COL (MISCELLANEOUS) IMPLANT
TUBE CONNECTING 12'X1/4 (SUCTIONS) ×1
TUBE CONNECTING 12X1/4 (SUCTIONS) ×2 IMPLANT
UNDERPAD 30X30 INCONTINENT (UNDERPADS AND DIAPERS) ×3 IMPLANT
WATER STERILE IRR 1000ML POUR (IV SOLUTION) IMPLANT
YANKAUER SUCT BULB TIP NO VENT (SUCTIONS) ×3 IMPLANT

## 2015-03-18 NOTE — ED Notes (Signed)
Jeffery, PA at bedside. 
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
## 2015-03-18 NOTE — Anesthesia Postprocedure Evaluation (Signed)
  Anesthesia Post-op Note  Patient: Mathew Martin  Procedure(s) Performed: Procedure(s): EUA/Ridge Sigmoidscopy/Drainage of pelvic abscess (N/A)  Patient Location: PACU  Anesthesia Type:General  Level of Consciousness: awake  Airway and Oxygen Therapy: Patient Spontanous Breathing  Post-op Pain: mild  Post-op Assessment: Post-op Vital signs reviewed  Post-op Vital Signs: Reviewed  Last Vitals:  Filed Vitals:   03/18/15 1345  BP: 137/68  Pulse: 106  Temp:   Resp: 21    Complications: No apparent anesthesia complications

## 2015-03-18 NOTE — Progress Notes (Signed)
ANTIBIOTIC CONSULT NOTE - INITIAL  Pharmacy Consult for vancomycin, zosyn Indication: wound infection  No Known Allergies  Patient Measurements: Height: 6\' 2"  (188 cm) Weight: 230 lb (104.327 kg) IBW/kg (Calculated) : 82.2 Adjusted Body Weight:   Vital Signs: Temp: 99 F (37.2 C) (05/20 1453) Temp Source: Oral (05/20 1453) BP: 129/71 mmHg (05/20 1453) Pulse Rate: 106 (05/20 1453) Intake/Output from previous day:   Intake/Output from this shift: Total I/O In: 4780 [P.O.:180; I.V.:2100; IV Piggyback:2500] Out: 205 [Urine:165; Blood:40]  Labs:  Recent Labs  03/18/15 0720 03/18/15 0746  WBC 7.6  --   HGB 12.9* 13.3  PLT 145*  --   CREATININE 1.40* 1.30*   Estimated Creatinine Clearance: 118.6 mL/min (by C-G formula based on Cr of 1.3). No results for input(s): VANCOTROUGH, VANCOPEAK, VANCORANDOM, GENTTROUGH, GENTPEAK, GENTRANDOM, TOBRATROUGH, TOBRAPEAK, TOBRARND, AMIKACINPEAK, AMIKACINTROU, AMIKACIN in the last 72 hours.   Microbiology: No results found for this or any previous visit (from the past 720 hour(s)).  Medical History: History reviewed. No pertinent past medical history.  Medications:  Prescriptions prior to admission  Medication Sig Dispense Refill Last Dose  . acetaminophen (TYLENOL) 325 MG tablet Take 650 mg by mouth every 6 (six) hours as needed for mild pain.   Past Month at Unknown time  . cephALEXin (KEFLEX) 500 MG capsule Take 1 capsule (500 mg total) by mouth 4 (four) times daily. 28 capsule 0 03/17/2015 at Unknown time  . Cetirizine HCl (ZYRTEC ALLERGY PO) Take 1 tablet by mouth daily as needed (allergies).    Past Month at Unknown time  . ibuprofen (ADVIL,MOTRIN) 200 MG tablet Take 200 mg by mouth every 6 (six) hours as needed for fever or moderate pain.   Past Month at Unknown time  . SALINE NASAL SPRAY NA Place 2 sprays into the nose daily as needed (allergies).    Past Month at Unknown time   Assessment: 19 yo male with perirectal abscess,  was seen at Urgent Care a couple days ago where he was started on Keflex. Pt is s/p I&D of abscess today. Tmax/24h 103.2, white count ok, HR up to 120, BP wnl.   Goal of Therapy:  Vancomycin trough level 10-15 mcg/ml  Plan:  -Vancomycin 1 g IV q8h -Zosyn 3.375 g IV q8h -F/u cultures, VT at Css   Agapito GamesAlison Darly Massi, PharmD, BCPS Clinical Pharmacist Pager: [phone] 03/18/2015 4:16 PM
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
## 2015-03-18 NOTE — ED Notes (Signed)
Lab results reported to Dr.Pickering. 

## 2015-03-18 NOTE — Anesthesia Preprocedure Evaluation (Signed)
Anesthesia Evaluation  Patient identified by MRN, date of birth, ID band Patient awake  General Assessment Comment:History noted. CE  Reviewed: Allergy & Precautions, NPO status , Patient's Chart, lab work & pertinent test results  Airway Mallampati: II  TM Distance: >3 FB Neck ROM: Full    Dental   Pulmonary  breath sounds clear to auscultation        Cardiovascular Rhythm:Regular Rate:Normal     Neuro/Psych    GI/Hepatic   Endo/Other    Renal/GU      Musculoskeletal   Abdominal   Peds  Hematology   Anesthesia Other Findings   Reproductive/Obstetrics                             Anesthesia Physical Anesthesia Plan  ASA: II  Anesthesia Plan:    Post-op Pain Management:    Induction: Intravenous  Airway Management Planned: Oral ETT  Additional Equipment:   Intra-op Plan:   Post-operative Plan: Extubation in OR  Informed Consent: I have reviewed the patients History and Physical, chart, labs and discussed the procedure including the risks, benefits and alternatives for the proposed anesthesia with the patient or authorized representative who has indicated his/her understanding and acceptance.   Dental advisory given  Plan Discussed with: CRNA, Anesthesiologist and Surgeon  Anesthesia Plan Comments:         Anesthesia Quick Evaluation

## 2015-03-18 NOTE — Transfer of Care (Signed)
Immediate Anesthesia Transfer of Care Note  Patient: Bing PlumeMarshall Amico  Procedure(s) Performed: Procedure(s): EUA/Ridge Sigmoidscopy/Drainage of pelvic abscess (N/A)  Patient Location: PACU  Anesthesia Type:General  Level of Consciousness: awake, alert , oriented and patient cooperative  Airway & Oxygen Therapy: Patient Spontanous Breathing and Patient connected to nasal cannula oxygen  Post-op Assessment: Report given to RN and Post -op Vital signs reviewed and stable  Post vital signs: Reviewed and stable  Last Vitals:  Filed Vitals:   03/18/15 1328  BP:   Pulse:   Temp: 37.1 C  Resp:     Complications: No apparent anesthesia complications
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
## 2015-03-18 NOTE — Care Management Note (Signed)
Case Management Note  Patient Details  Name: Mathew Martin MRN: [id number] Date of Birth: 13-Apr-1996  Subjective/Objective:  Pt adm on 03/18/15 with extraperitoneal abscess requiring I&D.  PTA, pt independent of ADLS.                Action/Plan: Will follow for dc needs as pt progresses.    Expected Discharge Date:                  Expected Discharge Plan:  Home/Self Care  In-House Referral:     Discharge planning Services  CM Consult  Post Acute Care Choice:    Choice offered to:     DME Arranged:    DME Agency:     HH Arranged:    HH Agency:     Status of Service:  In process, will continue to follow  Medicare Important Message Given:    Date Medicare IM Given:    Medicare IM give by:    Date Additional Medicare IM Given:    Additional Medicare Important Message give by:     If discussed at Long Length of Stay Meetings, dates discussed:    Additional Comments:  Glennon Macmerson, Joyann Spidle M, RN 03/18/2015, 2:45 PM [phone]

## 2015-03-19 LAB — CBC
HCT: 35.6 % — ABNORMAL LOW (ref 39.0–52.0)
Hemoglobin: 11.8 g/dL — ABNORMAL LOW (ref 13.0–17.0)
MCH: 28.4 pg (ref 26.0–34.0)
MCHC: 33.1 g/dL (ref 30.0–36.0)
MCV: 85.8 fL (ref 78.0–100.0)
Platelets: 147 10*3/uL — ABNORMAL LOW (ref 150–400)
RBC: 4.15 MIL/uL — ABNORMAL LOW (ref 4.22–5.81)
RDW: 13.1 % (ref 11.5–15.5)
WBC: 11.6 10*3/uL — AB (ref 4.0–10.5)

## 2015-03-19 LAB — URINE CULTURE: Colony Count: 2000

## 2015-03-19 NOTE — Progress Notes (Signed)
1 Day Post-Op  Subjective: Feels well, some pain and drainage  Objective: Vital signs in last 24 hours: Temp:  [97.6 F (36.4 C)-99 F (37.2 C)] 97.7 F (36.5 C) (05/21 0625) Pulse Rate:  [55-118] 66 (05/21 0625) Resp:  [13-25] 18 (05/21 0625) BP: (102-137)/(42-85) 126/58 mmHg (05/21 0625) SpO2:  [89 %-100 %] 100 % (05/21 0625) Weight:  [104.327 kg (230 lb)] 104.327 kg (230 lb) (05/20 1453) Last BM Date: 03/17/15  Intake/Output from previous day: 05/20 0701 - 05/21 0700 In: 7007.5 [P.O.:780; I.V.:3177.5; IV Piggyback:3050] Out: 205 [Urine:165; Blood:40] Intake/Output this shift:    Incision/Wound:mild erythema, penrose in place, mild tender   Lab Results:   Recent Labs  03/18/15 0720 03/18/15 0746 03/19/15 0304  WBC 7.6  --  11.6*  HGB 12.9* 13.3 11.8*  HCT 37.9* 39.0 35.6*  PLT 145*  --  147*   BMET  Recent Labs  03/18/15 0720 03/18/15 0746  NA 137 141  K 3.7 3.9  CL 107 109  CO2 18*  --   GLUCOSE 131* 135*  BUN 16 17  CREATININE 1.40* 1.30*  CALCIUM 8.9  --    PT/INR No results for input(s): LABPROT, INR in the last 72 hours. ABG No results for input(s): PHART, HCO3 in the last 72 hours.  Invalid input(s): PCO2, PO2  Studies/Results: Ct Pelvis W Contrast  03/18/2015   ADDENDUM REPORT: 03/18/2015 09:18  ADDENDUM: The clinical data should read recent perineal puncture injury with persistent pain and fevers.   Electronically Signed   By: Alcide Clever M.D.   On: 03/18/2015 09:18   03/18/2015   CLINICAL DATA:  Recent peroneal puncture injury with persistent pain and recent fever  EXAM: CT PELVIS WITH CONTRAST  TECHNIQUE: Multidetector CT imaging of the pelvis was performed using the standard protocol following the bolus administration of intravenous contrast.  CONTRAST:  OMNIPAQUE IOHEXOL 300 MG/ML  SOLN  COMPARISON:  03/13/2015  FINDINGS: The bladder is distended with opacified and non-opacified urine. Some mild bladder wall thickening is noted  likely related to incomplete distension. Considerable amount of inflammatory changes noted surrounding the bladder related to the recent injury. This extends posteriorly towards the sacrum and superiorly into the pelvis. Adjacent to the bladder inferiorly however there is a somewhat irregular 9.0 x 4.9 cm air-fluid collection. This area previously measured approximately 3.8 cm but was less discrete than that seen on the current exam. This likely represents an evolving abscess. On the delayed images this extends inferiorly to the perineum. The adjacent rectum appears within normal limits. The seminal vesicles are unremarkable. No findings to suggest acute hemorrhage are seen.  IMPRESSION: Involving air-fluid collection consistent with an abscess along the base of the urinary bladder eccentric to the left. This is increased significantly in size and extends inferiorly to the original puncture wound in the perineum. Some diffuse inflammatory changes are noted within the pelvis extending posteriorly towards the presacral region. These changes are increased from the prior exam. This area would be amenable percutaneous drainage if clinically desired.  These results were called by telephone at the time of interpretation on 03/18/2015 at 9:06 am to Dr. Benjiman Core , who verbally acknowledged these results.  Electronically Signed: By: Alcide Clever M.D. On: 03/18/2015 09:06   Dg Chest Portable 1 View  03/18/2015   CLINICAL DATA:  Code sepsis.  Fever.  EXAM: PORTABLE CHEST - 1 VIEW  COMPARISON:  None.  FINDINGS: The heart size and mediastinal contours are within  normal limits. Both lungs are clear. The visualized skeletal structures are unremarkable.  IMPRESSION: Normal chest.   Electronically Signed   By: Francene BoyersJames  Maxwell M.D.   On: 03/18/2015 07:48    Anti-infectives: Anti-infectives    Start     Dose/Rate Route Frequency Ordered Stop   03/18/15 1700  vancomycin (VANCOCIN) IVPB 1000 mg/200 mL premix     1,000  mg 200 mL/hr over 60 Minutes Intravenous Every 8 hours 03/18/15 1618     03/18/15 1630  piperacillin-tazobactam (ZOSYN) IVPB 3.375 g     3.375 g 12.5 mL/hr over 240 Minutes Intravenous 3 times per day 03/18/15 1619     03/18/15 0745  piperacillin-tazobactam (ZOSYN) IVPB 3.375 g     3.375 g 100 mL/hr over 30 Minutes Intravenous  Once 03/18/15 0731 03/18/15 0833   03/18/15 0745  vancomycin (VANCOCIN) IVPB 1000 mg/200 mL premix     1,000 mg 200 mL/hr over 60 Minutes Intravenous  Once 03/18/15 0731 03/18/15 0903      Assessment/Plan: Pod 1 procto/drainage ep abscess 1. Continue pain medication 2. Will continue iv abx for 24 more hours then plan dc home 3. lovenox scds  [facility] LLCWAKEFIELD,Blimy Napoleon 03/19/2015

## 2015-03-20 MED ORDER — HYDROCODONE-ACETAMINOPHEN 10-325 MG PO TABS: 1.0000 | ORAL_TABLET | ORAL | Status: DC | PRN

## 2015-03-20 MED ORDER — AMOXICILLIN-POT CLAVULANATE 875-125 MG PO TABS: 1.0000 | ORAL_TABLET | Freq: Two times a day (BID) | ORAL | Status: DC

## 2015-03-20 NOTE — Progress Notes (Signed)
2 Days Post-Op  Subjective: Feels fine, having bms/flatus, voiding  Objective: Vital signs in last 24 hours: Temp:  [98.2 F (36.8 C)-98.7 F (37.1 C)] 98.5 F (36.9 C) (05/22 0558) Pulse Rate:  [62-76] 62 (05/22 0558) Resp:  [16-17] 16 (05/22 0558) BP: (124-130)/(55-59) 124/55 mmHg (05/22 0558) SpO2:  [100 %] 100 % (05/22 0558) Last BM Date: 03/19/15  Intake/Output from previous day: 05/21 0701 - 05/22 0700 In: 840 [P.O.:840] Out: 3 [Urine:2; Stool:1] Intake/Output this shift:    GI: soft nt Incision/Wound:clean penroses in place    Lab Results:   Recent Labs  03/18/15 0720 03/18/15 0746 03/19/15 0304  WBC 7.6  --  11.6*  HGB 12.9* 13.3 11.8*  HCT 37.9* 39.0 35.6*  PLT 145*  --  147*   BMET  Recent Labs  03/18/15 0720 03/18/15 0746  NA 137 141  K 3.7 3.9  CL 107 109  CO2 18*  --   GLUCOSE 131* 135*  BUN 16 17  CREATININE 1.40* 1.30*  CALCIUM 8.9  --    PT/INR No results for input(s): LABPROT, INR in the last 72 hours. ABG No results for input(s): PHART, HCO3 in the last 72 hours.  Invalid input(s): PCO2, PO2  Studies/Results: Ct Pelvis W Contrast  03/18/2015   ADDENDUM REPORT: 03/18/2015 09:18  ADDENDUM: The clinical data should read recent perineal puncture injury with persistent pain and fevers.   Electronically Signed   By: Alcide CleverMark  Lukens M.D.   On: 03/18/2015 09:18   03/18/2015   CLINICAL DATA:  Recent peroneal puncture injury with persistent pain and recent fever  EXAM: CT PELVIS WITH CONTRAST  TECHNIQUE: Multidetector CT imaging of the pelvis was performed using the standard protocol following the bolus administration of intravenous contrast.  CONTRAST:  100mL OMNIPAQUE IOHEXOL 300 MG/ML  SOLN  COMPARISON:  03/13/2015  FINDINGS: The bladder is distended with opacified and non-opacified urine. Some mild bladder wall thickening is noted likely related to incomplete distension. Considerable amount of inflammatory changes noted surrounding the bladder  related to the recent injury. This extends posteriorly towards the sacrum and superiorly into the pelvis. Adjacent to the bladder inferiorly however there is a somewhat irregular 9.0 x 4.9 cm air-fluid collection. This area previously measured approximately 3.8 cm but was less discrete than that seen on the current exam. This likely represents an evolving abscess. On the delayed images this extends inferiorly to the perineum. The adjacent rectum appears within normal limits. The seminal vesicles are unremarkable. No findings to suggest acute hemorrhage are seen.  IMPRESSION: Involving air-fluid collection consistent with an abscess along the base of the urinary bladder eccentric to the left. This is increased significantly in size and extends inferiorly to the original puncture wound in the perineum. Some diffuse inflammatory changes are noted within the pelvis extending posteriorly towards the presacral region. These changes are increased from the prior exam. This area would be amenable percutaneous drainage if clinically desired.  These results were called by telephone at the time of interpretation on 03/18/2015 at 9:06 am to Dr. Benjiman CoreNATHAN PICKERING , who verbally acknowledged these results.  Electronically Signed: By: Alcide CleverMark  Lukens M.D. On: 03/18/2015 09:06    Anti-infectives: Anti-infectives    Start     Dose/Rate Route Frequency Ordered Stop   03/18/15 1700  vancomycin (VANCOCIN) IVPB 1000 mg/200 mL premix     1,000 mg 200 mL/hr over 60 Minutes Intravenous Every 8 hours 03/18/15 1618     03/18/15 1630  piperacillin-tazobactam (  ZOSYN) IVPB 3.375 g     3.375 g 12.5 mL/hr over 240 Minutes Intravenous 3 times per day 03/18/15 1619     03/18/15 0745  piperacillin-tazobactam (ZOSYN) IVPB 3.375 g     3.375 g 100 mL/hr over 30 Minutes Intravenous  Once 03/18/15 0731 03/18/15 0833   03/18/15 0745  vancomycin (VANCOCIN) IVPB 1000 mg/200 mL premix     1,000 mg 200 mL/hr over 60 Minutes Intravenous  Once  03/18/15 0731 03/18/15 1610      Assessment/Plan: Pod 2 procto/drainage ep abscess Dc home today with f/u this week Oral abx  [facility] 03/20/2015

## 2015-03-20 NOTE — Discharge Instructions (Signed)
May shower.  Get stool softener and use with narcotics.  May use over the counter ibuprofen also.

## 2015-03-21 LAB — CULTURE, BLOOD (ROUTINE X 2)

## 2015-03-22 ENCOUNTER — Encounter (HOSPITAL_COMMUNITY): Payer: Self-pay | Admitting: General Surgery

## 2015-03-22 LAB — CULTURE, ROUTINE-ABSCESS: GRAM STAIN: NONE SEEN

## 2015-03-23 LAB — ANAEROBIC CULTURE: GRAM STAIN: NONE SEEN

## 2015-03-24 LAB — CULTURE, BLOOD (ROUTINE X 2)

## 2015-03-25 ENCOUNTER — Telehealth (HOSPITAL_COMMUNITY): Payer: Self-pay | Admitting: Emergency Medicine

## 2015-03-25 NOTE — Telephone Encounter (Signed)
Continue current antibiotics until they are gone, but the wound was pretty clean in the office so he shouldn't need any further antibiotics.  Watch for signs of infection such as worsening swelling, redness, increased drainage.

## 2015-04-06 NOTE — Discharge Summary (Signed)
Physician Discharge Summary  Mathew Martin ZOX:[id number] DOB: [birth year] DOA: 03/18/2015  PCP: Eartha Inch, MD  Consultation:  Admit date: 03/18/2015 Discharge date: 03/20/2015  Recommendations for Outpatient Follow-up:   Follow-up Information    Follow up with [facility] On 03/23/2015.   Why:  arrive by 2:15PM for a post op check with surgeons advanced provider/central [facility] surgery   Contact information:   [address] [place] [address] [phone]     Discharge Diagnoses:  1. Accidental penetrating injury to left perirectal area 2. Extraperitoneal pelvic abscess    Surgical Procedure: EUA/ridge sigmoidoscopy/drainage of pelvic abscess---Dr. Lindie Spruce 03/18/15  Discharge Condition: stable Disposition: home  Diet recommendation: regular  Filed Weights   03/18/15 0734 03/18/15 1453  Weight: 104.599 kg (230 lb 9.6 oz) 104.327 kg (230 lb)       Hospital Course:  Mathew Martin is a healthy 19 year old male who presented to [facility] several days following a penetrating injury to left perirectal area.  Initially following the injury he had a CT which was normal and he was discharged home.  He came back with fevers and signs of sepsis.  A repeat CT showed a pelvic abscess which was extraperitoneal.  He underwent the procedure listed above which he tolerated well and was transferred to the floor for IV antibiotics.  On POD#2 he was felt stable for discharge with follow up in the trauma clinic.  He was asked to call with questions or concerns.   Please note, I did not personally examine the patient on the day of discharge.  Discharge Instructions     Medication List    TAKE these medications        acetaminophen 325 MG tablet  Commonly known as:  TYLENOL  Take 650 mg by mouth every 6 (six) hours as needed for mild pain.     amoxicillin-clavulanate 875-125 MG per tablet  Commonly known as:  AUGMENTIN  Take 1 tablet  by mouth 2 (two) times daily.     cephALEXin 500 MG capsule  Commonly known as:  KEFLEX  Take 1 capsule (500 mg total) by mouth 4 (four) times daily.     HYDROcodone-acetaminophen 10-325 MG per tablet  Commonly known as:  NORCO  Take 1 tablet by mouth every 4 (four) hours as needed (1/2 tablet for mild pain, 1 tablet for moderate pain, 2 tablets for severe pain).     ibuprofen 200 MG tablet  Commonly known as:  ADVIL,MOTRIN  Take 200 mg by mouth every 6 (six) hours as needed for fever or moderate pain.     SALINE NASAL SPRAY NA  Place 2 sprays into the nose daily as needed (allergies).     ZYRTEC ALLERGY PO  Take 1 tablet by mouth daily as needed (allergies).           Follow-up Information    Follow up with [facility] On 03/23/2015.   Why:  arrive by 2:15PM for a post op check with surgeons advanced provider/central [facility] surgery   Contact information:   [address] [place] [address] [phone]       The results of significant diagnostics from this hospitalization (including imaging, microbiology, ancillary and laboratory) are listed below for reference.    Significant Diagnostic Studies: Ct Pelvis W Contrast  03/18/2015   ADDENDUM REPORT: 03/18/2015 09:18  ADDENDUM: The clinical data should read recent perineal puncture injury with persistent pain  and fevers.   Electronically Signed   By: Alcide Clever M.D.   On: 03/18/2015 09:18   03/18/2015   CLINICAL DATA:  Recent peroneal puncture injury with persistent pain and recent fever  EXAM: CT PELVIS WITH CONTRAST  TECHNIQUE: Multidetector CT imaging of the pelvis was performed using the standard protocol following the bolus administration of intravenous contrast.  CONTRAST:  OMNIPAQUE IOHEXOL 300 MG/ML  SOLN  COMPARISON:  03/13/2015  FINDINGS: The bladder is distended with opacified and non-opacified urine. Some mild bladder wall thickening is noted likely related to  incomplete distension. Considerable amount of inflammatory changes noted surrounding the bladder related to the recent injury. This extends posteriorly towards the sacrum and superiorly into the pelvis. Adjacent to the bladder inferiorly however there is a somewhat irregular 9.0 x 4.9 cm air-fluid collection. This area previously measured approximately 3.8 cm but was less discrete than that seen on the current exam. This likely represents an evolving abscess. On the delayed images this extends inferiorly to the perineum. The adjacent rectum appears within normal limits. The seminal vesicles are unremarkable. No findings to suggest acute hemorrhage are seen.  IMPRESSION: Involving air-fluid collection consistent with an abscess along the base of the urinary bladder eccentric to the left. This is increased significantly in size and extends inferiorly to the original puncture wound in the perineum. Some diffuse inflammatory changes are noted within the pelvis extending posteriorly towards the presacral region. These changes are increased from the prior exam. This area would be amenable percutaneous drainage if clinically desired.  These results were called by telephone at the time of interpretation on 03/18/2015 at 9:06 am to Dr. Benjiman Core , who verbally acknowledged these results.  Electronically Signed: By: Alcide Clever M.D. On: 03/18/2015 09:06   Ct Abdomen Pelvis W Contrast  03/13/2015   CLINICAL DATA:  Larey Seat onto bucket of grease guns while playing with brother. Puncture wound at the anus. Initial encounter.  EXAM: CT ABDOMEN AND PELVIS WITH CONTRAST  TECHNIQUE: Multidetector CT imaging of the abdomen and pelvis was performed using the standard protocol following bolus administration of intravenous contrast.  CONTRAST:  OMNIPAQUE IOHEXOL 300 MG/ML  SOLN  COMPARISON:  None.  FINDINGS: The visualized lung bases are clear.  The liver and spleen are unremarkable in appearance. The gallbladder is within  normal limits. The pancreas and adrenal glands are unremarkable.  The kidneys are unremarkable in appearance. There is no evidence of hydronephrosis. No renal or ureteral stones are seen. No perinephric stranding is appreciated.  No free fluid is identified. The small bowel is unremarkable in appearance. The stomach is within normal limits. No acute vascular abnormalities are seen.  The appendix is normal in caliber and contains air, without evidence of appendicitis. The colon is unremarkable in appearance. The anorectal canal is within normal limits.  There appears to be a puncture wound at the left side of the anus, with minimal air tracking about the base of the prostate, anterior to the prostate, and anterior and to the left of the bladder. Mild associated soft tissue stranding is noted about the left anterior aspect of the bladder, within the space of Retzius. This most likely reflects extension of the injury adjacent to the bladder. There is no evidence of bladder perforation or significant hematoma. Would correlate with clinical symptoms to exclude prostatic urethral injury. The underlying bowel is unremarkable in appearance.  The bladder is otherwise grossly unremarkable in appearance. There is likely mild injury to  the anterior aspect of the prostate. No inguinal lymphadenopathy is seen.  No acute osseous abnormalities are identified.  IMPRESSION: Puncture wound at the left side of the anus, with minimal air tracking about the base of the prostate, anterior to the prostate, and anterior and to the left of the bladder. Mild associated soft tissue stranding about the left anterior aspect of the bladder, within the space of Retzius. This most likely reflects extension of the injury adjacent to the bladder. No evidence of bladder perforation or significant hematoma. Would correlate with clinical symptoms to exclude prostatic urethral injury. Underlying bowel is unremarkable in appearance.   Electronically  Signed   By: Roanna RaiderJeffery  Chang M.D.   On: 03/13/2015 20:30   Dg Chest Portable 1 View  03/18/2015   CLINICAL DATA:  Code sepsis.  Fever.  EXAM: PORTABLE CHEST - 1 VIEW  COMPARISON:  None.  FINDINGS: The heart size and mediastinal contours are within normal limits. Both lungs are clear. The visualized skeletal structures are unremarkable.  IMPRESSION: Normal chest.   Electronically Signed   By: Francene BoyersJames  Maxwell M.D.   On: 03/18/2015 07:48    Microbiology: No results found for this or any previous visit (from the past 240 hour(s)).   Labs: Basic Metabolic Panel: No results for input(s): NA, K, CL, CO2, GLUCOSE, BUN, CREATININE, CALCIUM, MG, PHOS in the last 168 hours. Liver Function Tests: No results for input(s): AST, ALT, ALKPHOS, BILITOT, PROT, ALBUMIN in the last 168 hours. No results for input(s): LIPASE, AMYLASE in the last 168 hours. No results for input(s): AMMONIA in the last 168 hours. CBC: No results for input(s): WBC, NEUTROABS, HGB, HCT, MCV, PLT in the last 168 hours. Cardiac Enzymes: No results for input(s): CKTOTAL, CKMB, CKMBINDEX, TROPONINI in the last 168 hours. BNP: BNP (last 3 results) No results for input(s): BNP in the last 8760 hours.  ProBNP (last 3 results) No results for input(s): PROBNP in the last 8760 hours.  CBG: No results for input(s): GLUCAP in the last 168 hours.  Active Problems:   Abdominopelvic abscess   Time coordinating discharge: <30 mins  Signed:  Makenize Messman, ANP-BC

## 2019-07-03 ENCOUNTER — Other Ambulatory Visit: Payer: Self-pay

## 2019-07-03 ENCOUNTER — Emergency Department (HOSPITAL_COMMUNITY): Payer: BLUE CROSS/BLUE SHIELD

## 2019-07-03 ENCOUNTER — Emergency Department (HOSPITAL_COMMUNITY)
Admission: EM | Admit: 2019-07-03 | Discharge: 2019-07-03 | Disposition: A | Discharge status: Home / Self Care | Payer: BLUE CROSS/BLUE SHIELD | Attending: Emergency Medicine | Admitting: Emergency Medicine

## 2019-07-03 DIAGNOSIS — Y9389 Activity, other specified: Secondary | ICD-10-CM | POA: Diagnosis not present

## 2019-07-03 DIAGNOSIS — Y92410 Unspecified street and highway as the place of occurrence of the external cause: Secondary | ICD-10-CM | POA: Insufficient documentation

## 2019-07-03 DIAGNOSIS — S32051A Stable burst fracture of fifth lumbar vertebra, initial encounter for closed fracture: Secondary | ICD-10-CM | POA: Diagnosis not present

## 2019-07-03 DIAGNOSIS — Z79899 Other long term (current) drug therapy: Secondary | ICD-10-CM | POA: Diagnosis not present

## 2019-07-03 DIAGNOSIS — S30811A Abrasion of abdominal wall, initial encounter: Secondary | ICD-10-CM | POA: Insufficient documentation

## 2019-07-03 DIAGNOSIS — M545 Low back pain, unspecified: Secondary | ICD-10-CM

## 2019-07-03 DIAGNOSIS — Y999 Unspecified external cause status: Secondary | ICD-10-CM | POA: Insufficient documentation

## 2019-07-03 DIAGNOSIS — Z23 Encounter for immunization: Secondary | ICD-10-CM | POA: Insufficient documentation

## 2019-07-03 DIAGNOSIS — R109 Unspecified abdominal pain: Secondary | ICD-10-CM | POA: Insufficient documentation

## 2019-07-03 DIAGNOSIS — S3992XA Unspecified injury of lower back, initial encounter: Secondary | ICD-10-CM | POA: Diagnosis present

## 2019-07-03 LAB — CBC
HCT: 44.3 % (ref 39.0–52.0)
Hemoglobin: 14.8 g/dL (ref 13.0–17.0)
MCH: 28.9 pg (ref 26.0–34.0)
MCHC: 33.4 g/dL (ref 30.0–36.0)
MCV: 86.5 fL (ref 80.0–100.0)
Platelets: 158 10*3/uL (ref 150–400)
RBC: 5.12 MIL/uL (ref 4.22–5.81)
RDW: 12.5 % (ref 11.5–15.5)
WBC: 12.1 10*3/uL — ABNORMAL HIGH (ref 4.0–10.5)
nRBC: 0 % (ref 0.0–0.2)

## 2019-07-03 LAB — SAMPLE TO BLOOD BANK

## 2019-07-03 LAB — COMPREHENSIVE METABOLIC PANEL
ALT: 33 U/L (ref 0–44)
AST: 34 U/L (ref 15–41)
Albumin: 4.2 g/dL (ref 3.5–5.0)
Alkaline Phosphatase: 66 U/L (ref 38–126)
Anion gap: 8 (ref 5–15)
BUN: 16 mg/dL (ref 6–20)
CO2: 23 mmol/L (ref 22–32)
Calcium: 9.2 mg/dL (ref 8.9–10.3)
Chloride: 106 mmol/L (ref 98–111)
Creatinine, Ser: 1.05 mg/dL (ref 0.61–1.24)
GFR calc Af Amer: >60 mL/min (ref >60)
GFR calc non Af Amer: >60 mL/min (ref >60)
Glucose, Bld: 108 mg/dL — ABNORMAL HIGH (ref 70–99)
Potassium: 3.7 mmol/L (ref 3.5–5.1)
Sodium: 137 mmol/L (ref 135–145)
Total Bilirubin: 0.6 mg/dL (ref 0.3–1.2)
Total Protein: 6.9 g/dL (ref 6.5–8.1)

## 2019-07-03 LAB — URINALYSIS, ROUTINE W REFLEX MICROSCOPIC
Bilirubin Urine: NEGATIVE
Glucose, UA: NEGATIVE mg/dL
Hgb urine dipstick: NEGATIVE
Ketones, ur: NEGATIVE mg/dL
Leukocytes,Ua: NEGATIVE
Nitrite: NEGATIVE
Protein, ur: NEGATIVE mg/dL
Specific Gravity, Urine: 1.016 (ref 1.005–1.030)
pH: 6 (ref 5.0–8.0)

## 2019-07-03 LAB — I-STAT CHEM 8, ED
BUN: 17 mg/dL (ref 6–20)
Calcium, Ion: 1.26 mmol/L (ref 1.15–1.40)
Chloride: 103 mmol/L (ref 98–111)
Creatinine, Ser: 1 mg/dL (ref 0.61–1.24)
Glucose, Bld: 100 mg/dL — ABNORMAL HIGH (ref 70–99)
HCT: 44 % (ref 39.0–52.0)
Hemoglobin: 15 g/dL (ref 13.0–17.0)
Potassium: 3.7 mmol/L (ref 3.5–5.1)
Sodium: 141 mmol/L (ref 135–145)
TCO2: 24 mmol/L (ref 22–32)

## 2019-07-03 LAB — CDS SEROLOGY

## 2019-07-03 LAB — PROTIME-INR
INR: 1 (ref 0.8–1.2)
Prothrombin Time: 12.6 seconds (ref 11.4–15.2)

## 2019-07-03 LAB — LACTIC ACID, PLASMA: Lactic Acid, Venous: 0.9 mmol/L (ref 0.5–1.9)

## 2019-07-03 LAB — ETHANOL: Alcohol, Ethyl (B): <10 mg/dL (ref <10)

## 2019-07-03 MED ORDER — METHOCARBAMOL 500 MG PO TABS
500.0000 mg | ORAL_TABLET | Freq: Once | ORAL | Status: AC
  Administered 2019-07-03: 500 mg via ORAL
  Filled 2019-07-03: qty 1

## 2019-07-03 MED ORDER — OXYCODONE HCL 5 MG PO TABS: 5.0000 mg | ORAL_TABLET | Freq: Four times a day (QID) | ORAL | 0 refills | Status: DC | PRN

## 2019-07-03 MED ORDER — METHOCARBAMOL 500 MG PO TABS: 500.0000 mg | ORAL_TABLET | Freq: Two times a day (BID) | ORAL | 0 refills | Status: DC

## 2019-07-03 MED ORDER — HYDROCODONE-ACETAMINOPHEN 5-325 MG PO TABS
1.0000 | ORAL_TABLET | Freq: Once | ORAL | Status: AC
  Administered 2019-07-03: 1 via ORAL
  Filled 2019-07-03: qty 1

## 2019-07-03 MED ORDER — TETANUS-DIPHTH-ACELL PERTUSSIS 5-2.5-18.5 LF-MCG/0.5 IM SUSP
0.5000 mL | Freq: Once | INTRAMUSCULAR | Status: AC
  Administered 2019-07-03: 0.5 mL via INTRAMUSCULAR
  Filled 2019-07-03: qty 0.5

## 2019-07-03 MED ORDER — IBUPROFEN 800 MG PO TABS: 800.0000 mg | ORAL_TABLET | Freq: Three times a day (TID) | ORAL | 0 refills | Status: DC

## 2019-07-03 MED ORDER — IOHEXOL 300 MG/ML  SOLN
100.0000 mL | Freq: Once | INTRAMUSCULAR | Status: AC | PRN
  Administered 2019-07-03: 100 mL via INTRAVENOUS

## 2019-07-03 NOTE — Progress Notes (Signed)
Orthopedic Tech Progress Note Patient Details:  [name] 1996/09/02 [id number] Called [employer] for tlso. Patient ID: Mathew Martin, male   DOB: 1995/12/18, 23 y.o.   MRN: [id number]   Melony Overly T 07/03/2019, 5:50 AM

## 2019-07-03 NOTE — ED Notes (Signed)
Patient transported to CT 
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
## 2019-07-03 NOTE — ED Notes (Signed)
Ortho paged and stated tech for TLSO brace will be called in

## 2019-07-03 NOTE — ED Notes (Signed)
Pt dc'd home with all belongings, a/o x4, driven home by family  
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
## 2019-07-03 NOTE — Discharge Instructions (Addendum)
1. Medications: robaxin, Ibuprofen, Roxicodone for breakthrough pain, usual home medications 2. Treatment: rest, drink plenty of fluids, gentle stretching as discussed, alternate ice and heat 3. Follow Up: Please followup with your neurosurgery as directed;  Return to the ER for worsening back pain, difficulty walking, loss of bowel or bladder control or other concerning symptoms
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
## 2022-05-29 ENCOUNTER — Telehealth: Payer: Self-pay | Admitting: Family Medicine

## 2022-05-29 NOTE — Telephone Encounter (Signed)
I can Please schedule appt

## 2022-05-29 NOTE — Telephone Encounter (Signed)
Patient mother called and asked if Dr. Milinda Antis will see him as a new patient.

## 2022-06-08 ENCOUNTER — Encounter: Payer: Self-pay | Admitting: Family Medicine

## 2022-06-08 ENCOUNTER — Ambulatory Visit (INDEPENDENT_AMBULATORY_CARE_PROVIDER_SITE_OTHER): Payer: 59 | Admitting: Family Medicine

## 2022-06-08 DIAGNOSIS — Z0000 Encounter for general adult medical examination without abnormal findings: Secondary | ICD-10-CM

## 2022-06-08 LAB — CBC WITH DIFFERENTIAL/PLATELET
Basophils Absolute: 0 10*3/uL (ref 0.0–0.1)
Basophils Relative: 0.7 % (ref 0.0–3.0)
Eosinophils Absolute: 0.1 10*3/uL (ref 0.0–0.7)
Eosinophils Relative: 1.1 % (ref 0.0–5.0)
HCT: 42.5 % (ref 39.0–52.0)
Hemoglobin: 14.1 g/dL (ref 13.0–17.0)
Lymphocytes Relative: 29.8 % (ref 12.0–46.0)
Lymphs Abs: 1.5 10*3/uL (ref 0.7–4.0)
MCHC: 33.3 g/dL (ref 30.0–36.0)
MCV: 85.5 fl (ref 78.0–100.0)
Monocytes Absolute: 0.4 10*3/uL (ref 0.1–1.0)
Monocytes Relative: 8.7 % (ref 3.0–12.0)
Neutro Abs: 2.9 10*3/uL (ref 1.4–7.7)
Neutrophils Relative %: 59.7 % (ref 43.0–77.0)
Platelets: 135 10*3/uL — ABNORMAL LOW (ref 150.0–400.0)
RBC: 4.96 Mil/uL (ref 4.22–5.81)
RDW: 13.5 % (ref 11.5–15.5)
WBC: 4.9 10*3/uL (ref 4.0–10.5)

## 2022-06-08 LAB — LIPID PANEL
Cholesterol: 146 mg/dL (ref 0–200)
HDL: 38.1 mg/dL — ABNORMAL LOW (ref >39.00)
LDL Cholesterol: 87 mg/dL (ref 0–99)
NonHDL: 107.9
Total CHOL/HDL Ratio: 4
Triglycerides: 107 mg/dL (ref 0.0–149.0)
VLDL: 21.4 mg/dL (ref 0.0–40.0)

## 2022-06-08 LAB — COMPREHENSIVE METABOLIC PANEL
ALT: 23 U/L (ref 0–53)
AST: 19 U/L (ref 0–37)
Albumin: 4.5 g/dL (ref 3.5–5.2)
Alkaline Phosphatase: 58 U/L (ref 39–117)
BUN: 14 mg/dL (ref 6–23)
CO2: 31 mEq/L (ref 19–32)
Calcium: 9.2 mg/dL (ref 8.4–10.5)
Chloride: 104 mEq/L (ref 96–112)
Creatinine, Ser: 1.14 mg/dL (ref 0.40–1.50)
GFR: 88.94 mL/min (ref >60.00)
Glucose, Bld: 68 mg/dL — ABNORMAL LOW (ref 70–99)
Potassium: 4 mEq/L (ref 3.5–5.1)
Sodium: 140 mEq/L (ref 135–145)
Total Bilirubin: 0.6 mg/dL (ref 0.2–1.2)
Total Protein: 6.7 g/dL (ref 6.0–8.3)

## 2022-06-08 LAB — TSH: TSH: 1.42 u[IU]/mL (ref 0.35–5.50)
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
## 2022-06-08 NOTE — Patient Instructions (Addendum)
Stay active Wear sunscreen   Consider sunglasses also    Labs today   Continue a balance diet with fruits and vegetables

## 2022-06-10 NOTE — Assessment & Plan Note (Signed)
Reviewed health habits including diet and exercise and skin cancer prevention Reviewed appropriate screening tests for age  Also reviewed health mt list, fam hx and immunization status , as well as social and family history   See HPI Labs ordered Pt declines flu shot and covid vaccine  Declines STD screening  Discussed imp of water intake working in heat, also use of sun protection and eye protection

## 2022-07-20 ENCOUNTER — Ambulatory Visit (INDEPENDENT_AMBULATORY_CARE_PROVIDER_SITE_OTHER): Payer: No Typology Code available for payment source | Admitting: Family Medicine

## 2022-07-20 ENCOUNTER — Encounter: Payer: Self-pay | Admitting: Family Medicine

## 2022-07-20 VITALS — BP 118/60 | HR 69 | Temp 98.7°F | Ht 75.59 in | Wt 246.2 lb

## 2022-07-20 DIAGNOSIS — L600 Ingrowing nail: Secondary | ICD-10-CM

## 2022-07-20 MED ORDER — CEPHALEXIN 500 MG PO CAPS: 500.0000 mg | ORAL_CAPSULE | Freq: Three times a day (TID) | ORAL | 0 refills | Status: DC

## 2022-07-20 NOTE — Progress Notes (Signed)
Patient ID: Mathew Martin, male    DOB: 1995/11/08, 26 y.o.   MRN: [id number]  This visit was conducted in person.  BP 118/60   Pulse 69   Temp 98.7 F (37.1 C) (Oral)   Ht 6' 3.59" (1.92 m)   Wt 246 lb 4 oz (111.7 kg)   SpO2 96%   BMI 30.30 kg/m    CC:  Chief Complaint  Patient presents with   Ingrown Toenail    Right Big Toe    Subjective:   HPI: Mathew Martin is a 26 y.o. male presenting on 07/20/2022 for Ingrown Toenail (Right Big Toe)    2 weeks ago he noted soreness on  corner of right great toenail. Mild redness and swelling.  No injury.  Has treated with peroxide, no other treatments.   No other issues.  No numbness, no weakness.       Relevant past medical, surgical, family and social history reviewed and updated as indicated. Interim medical history since our last visit reviewed. Allergies and medications reviewed and updated. Outpatient Medications Prior to Visit  Medication Sig Dispense Refill   acetaminophen (TYLENOL) 325 MG tablet Take 650 mg by mouth every 6 (six) hours as needed for mild pain.     chlorhexidine (PERIDEX) 0.12 % solution SMARTSIG:0.5 Capful(s) By Mouth Twice Daily     ibuprofen (ADVIL) 600 MG tablet Take 600 mg by mouth every 6 (six) hours as needed.     ibuprofen (ADVIL) 800 MG tablet Take 1 tablet (800 mg total) by mouth 3 (three) times daily. 21 tablet 0   No facility-administered medications prior to visit.     Per HPI unless specifically indicated in ROS section below Review of Systems  Constitutional:  Negative for fatigue and fever.  HENT:  Negative for ear pain.   Eyes:  Negative for pain.  Respiratory:  Negative for cough and shortness of breath.   Cardiovascular:  Negative for chest pain, palpitations and leg swelling.  Gastrointestinal:  Negative for abdominal pain.  Genitourinary:  Negative for dysuria.  Musculoskeletal:  Negative for arthralgias.  Neurological:  Negative for syncope, light-headedness and  headaches.  Psychiatric/Behavioral:  Negative for dysphoric mood.    Objective:  BP 118/60   Pulse 69   Temp 98.7 F (37.1 C) (Oral)   Ht 6' 3.59" (1.92 m)   Wt 246 lb 4 oz (111.7 kg)   SpO2 96%   BMI 30.30 kg/m   Wt Readings from Last 3 Encounters:  07/20/22 246 lb 4 oz (111.7 kg)  06/08/22 244 lb 3.2 oz (110.8 kg)  03/18/15 230 lb (104.3 kg) (98 %, Z= 2.10)*   * Growth percentiles are based on CDC (Boys, 2-20 Years) data.      Physical Exam Constitutional:      Appearance: He is well-developed.  HENT:     Head: Normocephalic.     Right Ear: Hearing normal.     Left Ear: Hearing normal.     Nose: Nose normal.  Neck:     Thyroid: No thyroid mass or thyromegaly.     Vascular: No carotid bruit.     Trachea: Trachea normal.  Cardiovascular:     Rate and Rhythm: Normal rate and regular rhythm.     Pulses: Normal pulses.     Heart sounds: Heart sounds not distant. No murmur heard.    No friction rub. No gallop.     Comments: No peripheral edema Pulmonary:     Effort: Pulmonary  effort is normal. No respiratory distress.     Breath sounds: Normal breath sounds.  Feet:     Right foot:     Toenail Condition: Right toenails are abnormally thick, long and ingrown. Fungal disease present.    Left foot:     Toenail Condition: Left toenails are abnormally thick and long. Fungal disease present.    Comments: Ingrown left great toe nail... mild redness and swelling laterally, no fluctuance, no pustule, no pain in fatty tissue  Normal sensation and strength in foot/toe Skin:    General: Skin is warm and dry.     Findings: No rash.  Psychiatric:        Speech: Speech normal.        Behavior: Behavior normal.        Thought Content: Thought content normal.       Results for orders placed or performed in visit on 06/08/22  TSH  Result Value Ref Range   TSH 1.42 0.35 - 5.50 uIU/mL  Lipid panel  Result Value Ref Range   Cholesterol 146 0 - 200 mg/dL   Triglycerides 107.0  0.0 - 149.0 mg/dL   HDL 38.10 (L) >39.00 mg/dL   VLDL 21.4 0.0 - 40.0 mg/dL   LDL Cholesterol 87 0 - 99 mg/dL   Total CHOL/HDL Ratio 4    NonHDL 107.90   Comprehensive metabolic panel  Result Value Ref Range   Sodium 140 135 - 145 mEq/L   Potassium 4.0 3.5 - 5.1 mEq/L   Chloride 104 96 - 112 mEq/L   CO2 31 19 - 32 mEq/L   Glucose, Bld 68 (L) 70 - 99 mg/dL   BUN 14 6 - 23 mg/dL   Creatinine, Ser 1.14 0.40 - 1.50 mg/dL   Total Bilirubin 0.6 0.2 - 1.2 mg/dL   Alkaline Phosphatase 58 39 - 117 U/L   AST 19 0 - 37 U/L   ALT 23 0 - 53 U/L   Total Protein 6.7 6.0 - 8.3 g/dL   Albumin 4.5 3.5 - 5.2 g/dL   GFR 88.94 >60.00 mL/min   Calcium 9.2 8.4 - 10.5 mg/dL  CBC with Differential/Platelet  Result Value Ref Range   WBC 4.9 4.0 - 10.5 K/uL   RBC 4.96 4.22 - 5.81 Mil/uL   Hemoglobin 14.1 13.0 - 17.0 g/dL   HCT 42.5 39.0 - 52.0 %   MCV 85.5 78.0 - 100.0 fl   MCHC 33.3 30.0 - 36.0 g/dL   RDW 13.5 11.5 - 15.5 %   Platelets 135.0 (L) 150.0 - 400.0 K/uL   Neutrophils Relative % 59.7 43.0 - 77.0 %   Lymphocytes Relative 29.8 12.0 - 46.0 %   Monocytes Relative 8.7 3.0 - 12.0 %   Eosinophils Relative 1.1 0.0 - 5.0 %   Basophils Relative 0.7 0.0 - 3.0 %   Neutro Abs 2.9 1.4 - 7.7 K/uL   Lymphs Abs 1.5 0.7 - 4.0 K/uL   Monocytes Absolute 0.4 0.1 - 1.0 K/uL   Eosinophils Absolute 0.1 0.0 - 0.7 K/uL   Basophils Absolute 0.0 0.0 - 0.1 K/uL     COVID 19 screen:  No recent travel or known exposure to COVID19 The patient denies respiratory symptoms of COVID 19 at this time. The importance of social distancing was discussed today.   Assessment and Plan    Problem List Items Addressed This Visit     Ingrown left greater toenail - Primary    Acute, treat with warm water soaks  and will complete a course of Keflex 500 mg 3 times daily for possible infected and associated with ingrown nail.  At this point there is no clear indication for incision and drainage as no fluctuance palpated.   Discussed redirection of toenail and correct toenail cutting procedure with patient in detail.  He will call for referral to podiatry for possible removal of edge of nail if not improving.        Kerby Nora, MD

## 2022-07-20 NOTE — Patient Instructions (Addendum)
Complete antibiotics x 7 days.  Elevate foot as able.  Start warm water soaks three times a day.  Call if not improving for referral podiatry.

## 2022-07-20 NOTE — Assessment & Plan Note (Signed)
Acute, treat with warm water soaks and will complete a course of Keflex 500 mg 3 times daily for possible infected and associated with ingrown nail.  At this point there is no clear indication for incision and drainage as no fluctuance palpated.  Discussed redirection of toenail and correct toenail cutting procedure with patient in detail.  He will call for referral to podiatry for possible removal of edge of nail if not improving.

## 2022-10-04 ENCOUNTER — Emergency Department (HOSPITAL_BASED_OUTPATIENT_CLINIC_OR_DEPARTMENT_OTHER): Payer: No Typology Code available for payment source | Admitting: Radiology

## 2022-10-04 ENCOUNTER — Other Ambulatory Visit: Payer: Self-pay

## 2022-10-04 ENCOUNTER — Encounter (HOSPITAL_BASED_OUTPATIENT_CLINIC_OR_DEPARTMENT_OTHER): Payer: Self-pay

## 2022-10-04 DIAGNOSIS — R0789 Other chest pain: Secondary | ICD-10-CM | POA: Diagnosis present

## 2022-10-04 LAB — BASIC METABOLIC PANEL WITH GFR
Anion gap: 12 (ref 5–15)
BUN: 17 mg/dL (ref 6–20)
CO2: 24 mmol/L (ref 22–32)
Calcium: 9.3 mg/dL (ref 8.9–10.3)
Chloride: 104 mmol/L (ref 98–111)
Creatinine, Ser: 1 mg/dL (ref 0.61–1.24)
GFR, Estimated: >60 mL/min
Glucose, Bld: 135 mg/dL — ABNORMAL HIGH (ref 70–99)
Potassium: 3.6 mmol/L (ref 3.5–5.1)
Sodium: 140 mmol/L (ref 135–145)

## 2022-10-04 LAB — CBC
HCT: 42.4 % (ref 39.0–52.0)
Hemoglobin: 14.6 g/dL (ref 13.0–17.0)
MCH: 28.8 pg (ref 26.0–34.0)
MCHC: 34.4 g/dL (ref 30.0–36.0)
MCV: 83.6 fL (ref 80.0–100.0)
Platelets: 164 K/uL (ref 150–400)
RBC: 5.07 MIL/uL (ref 4.22–5.81)
RDW: 12.7 % (ref 11.5–15.5)
WBC: 7.3 K/uL (ref 4.0–10.5)
nRBC: 0 % (ref 0.0–0.2)

## 2022-10-04 LAB — TROPONIN I (HIGH SENSITIVITY): Troponin I (High Sensitivity): <2 ng/L

## 2022-10-04 NOTE — ED Triage Notes (Signed)
POV, pt sts that left sided chest pains began one hour ago after eating, denies radiation. Denies any associated symptoms other than being shaky. Ambulatory, A&O x 4.

## 2022-10-05 ENCOUNTER — Emergency Department (HOSPITAL_BASED_OUTPATIENT_CLINIC_OR_DEPARTMENT_OTHER)
Admission: EM | Admit: 2022-10-05 | Discharge: 2022-10-05 | Disposition: A | Discharge status: Home / Self Care | Payer: No Typology Code available for payment source | Attending: Emergency Medicine | Admitting: Emergency Medicine

## 2022-10-05 ENCOUNTER — Encounter (HOSPITAL_BASED_OUTPATIENT_CLINIC_OR_DEPARTMENT_OTHER): Payer: Self-pay | Admitting: Emergency Medicine

## 2022-10-05 DIAGNOSIS — R0789 Other chest pain: Secondary | ICD-10-CM

## 2022-10-05 LAB — TROPONIN I (HIGH SENSITIVITY): Troponin I (High Sensitivity): <2 ng/L (ref <18)

## 2022-10-05 NOTE — ED Notes (Signed)
Pt agreeable with d/c plan as discussed by provider- this nurse has verbally reinforced d/c instructions and provided pt with written copy- pt acknowledges verbal understanding and denies any additional questions, concerns, needs- pt ambulatory at d/c independently with steady gait; vitals within baseline- no distress at d/c

## 2022-10-05 NOTE — ED Provider Notes (Signed)
[facility] Provider Note: Lowella Dell, MD, FACEP  CSN: [id number] MRN: [id number] ARRIVAL: 10/04/22 at 2117 ROOM: DB014/DB014   CHIEF COMPLAINT  Chest Pain   HISTORY OF PRESENT ILLNESS  10/05/22 1:08 AM Mathew Martin is a 26 y.o. male who was driving in about 4 hours ago.  This was just after eating a spicy chicken sandwich.  He suddenly developed left sided chest pain.  The pain was not sharp and he describes it as feeling somewhat like a muscle spasm.  The pain lasted only a couple of seconds at a time.  It recurred multiple times and resolved completely about the time he arrived in the ED.  He had no shortness of breath, diaphoresis or nausea.  He did have a shaking chill but is not aware of having a fever.  He has not been coughing or having other symptoms of respiratory illness.  He is asymptomatic now.  On arrival he rated the discomfort as a 2 out of 10.   Past Medical History:  Diagnosis Date   Belching    "alot" (03/18/2015)    Past Surgical History:  Procedure Laterality Date   INCISION AND DRAINAGE PERIRECTAL ABSCESS N/A 03/18/2015   Procedure: EUA/Ridge Sigmoidscopy/Drainage of pelvic abscess;  Surgeon: Jimmye Norman, MD;  Location: [facility] OR;  Service: General;  Laterality: N/A;   TRANSRECTAL DRAINAGE OF PELVIC ABSCESS  03/18/2015    Family History  Problem Relation Age of Onset   Diabetes Other        MGGF   Other Other        heart problem- PGGM    Social History   Tobacco Use   Smoking status: Never   Smokeless tobacco: Never  Vaping Use   Vaping Use: Never used  Substance Use Topics   Alcohol use: No   Drug use: No    Prior to Admission medications   Medication Sig Start Date End Date Taking? Authorizing Provider  acetaminophen (TYLENOL) 325 MG tablet Take 650 mg by mouth every 6 (six) hours as needed for mild pain.    [provider]  ibuprofen (ADVIL) 600 MG tablet Take 600 mg by mouth every 6 (six) hours as needed. 07/12/22    [provider]    Allergies Patient has no known allergies.   REVIEW OF SYSTEMS  Negative except as noted here or in the History of Present Illness.   PHYSICAL EXAMINATION  Initial Vital Signs Blood pressure (!) 164/84, pulse 93, temperature 98.8 F (37.1 C), resp. rate 20, height 6\' 2"  (1.88 m), weight 108.9 kg, SpO2 100 %.  Examination General: Well-developed, well-nourished male in no acute distress; appearance consistent with age of record HENT: normocephalic; atraumatic Eyes: Normal appearance Neck: supple Heart: regular rate and rhythm Lungs: clear to auscultation bilaterally Chest: Nontender Abdomen: soft; nondistended; nontender; bowel sounds present Extremities: No deformity; full range of motion; pulses normal Neurologic: Awake, alert and oriented; motor function intact in all extremities and symmetric; no facial droop Skin: Warm and dry Psychiatric: Normal mood and affect   RESULTS  Summary of this visit's results, reviewed and interpreted by myself:   EKG Interpretation  Date/Time:  Thursday October 04 2022 21:24:11 EST Ventricular Rate:  89 PR Interval:  166 QRS Duration: 90 QT Interval:  340 QTC Calculation: 413 R Axis:   67 Text Interpretation: Normal sinus rhythm Septal infarct , age undetermined Abnormal ECG No previous ECGs available Confirmed by Estaban Mainville (05-30-2000) on 10/05/2022 1:08:21 AM  Laboratory Studies: Results for orders placed or performed during the hospital encounter of 10/05/22 (from the past 24 hour(s))  Basic metabolic panel     Status: Abnormal   Collection Time: 10/04/22  9:27 PM  Result Value Ref Range   Sodium 140 135 - 145 mmol/L   Potassium 3.6 3.5 - 5.1 mmol/L   Chloride 104 98 - 111 mmol/L   CO2 24 22 - 32 mmol/L   Glucose, Bld 135 (H) 70 - 99 mg/dL   BUN 17 6 - 20 mg/dL   Creatinine, Ser 8.58 0.61 - 1.24 mg/dL   Calcium 9.3 8.9 - 85.0 mg/dL   GFR, Estimated >27 >74 mL/min   Anion gap 12 5 - 15   CBC     Status: None   Collection Time: 10/04/22  9:27 PM  Result Value Ref Range   WBC 7.3 4.0 - 10.5 K/uL   RBC 5.07 4.22 - 5.81 MIL/uL   Hemoglobin 14.6 13.0 - 17.0 g/dL   HCT 12.8 78.6 - 76.7 %   MCV 83.6 80.0 - 100.0 fL   MCH 28.8 26.0 - 34.0 pg   MCHC 34.4 30.0 - 36.0 g/dL   RDW 20.9 47.0 - 96.2 %   Platelets 164 150 - 400 K/uL   nRBC 0.0 0.0 - 0.2 %  Troponin I (High Sensitivity)     Status: None   Collection Time: 10/04/22  9:27 PM  Result Value Ref Range   Troponin I (High Sensitivity) <2 <18 ng/L  Troponin I (High Sensitivity)     Status: None   Collection Time: 10/05/22  1:22 AM  Result Value Ref Range   Troponin I (High Sensitivity) <2 <18 ng/L   Imaging Studies: DG Chest 2 View  Result Date: 10/04/2022 CLINICAL DATA:  Chest pain EXAM: CHEST - 2 VIEW COMPARISON:  03/18/2015 FINDINGS: The heart size and mediastinal contours are within normal limits. Both lungs are clear. The visualized skeletal structures are unremarkable. IMPRESSION: No acute abnormality of the lungs. Electronically Signed   By: Jearld Lesch M.D.   On: 10/04/2022 21:52    ED COURSE and MDM  Nursing notes, initial and subsequent vitals signs, including pulse oximetry, reviewed and interpreted by myself.  Vitals:   10/04/22 2120 10/04/22 2128 10/05/22 0131  BP:  (!) 164/84 (!) 144/88  Pulse:  93 74  Resp:  20 15  Temp:  98.8 F (37.1 C) 98.4 F (36.9 C)  TempSrc:   Oral  SpO2:  100% 100%  Weight: 108.9 kg    Height: 6\' 2"  (1.88 m)     Medications - No data to display  2:15 AM The patient's EKG is nonischemic.  His troponins are less than 2.  His pain was atypical for cardiac etiology. His HEART score is 1 (low risk).  The shaking chills suggest he may be developing a viral illness and he was advised to return should symptoms worsen.  PROCEDURES  Procedures   ED DIAGNOSES     ICD-10-CM   1. Atypical chest pain  R07.89          01-06-1988, MD 10/05/22 [phone]

## 2022-10-08 ENCOUNTER — Telehealth: Payer: Self-pay

## 2022-10-08 NOTE — Telephone Encounter (Signed)
Transition Care Management Unsuccessful Follow-up Telephone Call  Date of discharge and from where:  [facility] ER 10-05-22 Dx: atypical chest pain  Attempts:  1st Attempt  Reason for unsuccessful TCM follow-up call:  Unable to leave message   Woodfin Ganja LPN [facility] Nurse Health Advisor Direct Dial [phone]

## 2022-10-09 NOTE — Telephone Encounter (Signed)
Transition Care Management Unsuccessful Follow-up Telephone Call  Date of discharge and from where:  [facility]  Attempts:  2nd Attempt  Reason for unsuccessful TCM follow-up call:  Unable to leave message

## 2022-10-10 ENCOUNTER — Encounter: Payer: Self-pay | Admitting: Family Medicine

## 2022-10-10 ENCOUNTER — Ambulatory Visit: Payer: No Typology Code available for payment source | Admitting: Family Medicine

## 2022-10-10 VITALS — BP 124/70 | HR 71 | Temp 97.6°F | Ht 75.5 in | Wt 245.4 lb

## 2022-10-10 DIAGNOSIS — K219 Gastro-esophageal reflux disease without esophagitis: Secondary | ICD-10-CM | POA: Diagnosis not present

## 2022-10-10 MED ORDER — OMEPRAZOLE 20 MG PO CPDR: 20.0000 mg | DELAYED_RELEASE_CAPSULE | Freq: Every day | ORAL | 2 refills | Status: DC

## 2022-10-10 MED ORDER — FAMOTIDINE 20 MG PO TABS: 20.0000 mg | ORAL_TABLET | Freq: Every day | ORAL | 2 refills | Status: DC

## 2022-10-10 NOTE — Progress Notes (Signed)
Subjective:    Patient ID: Mathew Martin, male    DOB: 1996-05-30, 26 y.o.   MRN: [id number]  HPI Pt presents for f/u of ER visit for chest pain   Wt Readings from Last 3 Encounters:  10/10/22 245 lb 6 oz (111.3 kg)  10/04/22 240 lb (108.9 kg)  07/20/22 246 lb 4 oz (111.7 kg)   30.26 kg/m   Pt was seen at ER / [facility] on 12/8 Presented with left sided cp after eating spicy food  Felt like muscle spasm and came and went while driging  Denied sob, diarph or nausea but felt a shill briefly  EKG reassuring NSR /non eschemic    Results for orders placed or performed during the hospital encounter of 10/05/22  Basic metabolic panel  Result Value Ref Range   Sodium 140 135 - 145 mmol/L   Potassium 3.6 3.5 - 5.1 mmol/L   Chloride 104 98 - 111 mmol/L   CO2 24 22 - 32 mmol/L   Glucose, Bld 135 (H) 70 - 99 mg/dL   BUN 17 6 - 20 mg/dL   Creatinine, Ser 7.89 0.61 - 1.24 mg/dL   Calcium 9.3 8.9 - 38.1 mg/dL   GFR, Estimated >01 >75 mL/min   Anion gap 12 5 - 15  CBC  Result Value Ref Range   WBC 7.3 4.0 - 10.5 K/uL   RBC 5.07 4.22 - 5.81 MIL/uL   Hemoglobin 14.6 13.0 - 17.0 g/dL   HCT 10.2 58.5 - 27.7 %   MCV 83.6 80.0 - 100.0 fL   MCH 28.8 26.0 - 34.0 pg   MCHC 34.4 30.0 - 36.0 g/dL   RDW 82.4 23.5 - 36.1 %   Platelets 164 150 - 400 K/uL   nRBC 0.0 0.0 - 0.2 %  Troponin I (High Sensitivity)  Result Value Ref Range   Troponin I (High Sensitivity) <2 <18 ng/L  Troponin I (High Sensitivity)  Result Value Ref Range   Troponin I (High Sensitivity) <2 <18 ng/L   Having some reflux symptoms Burping  When he burps it burns  Sometimes he gets acid in throat /mouth  Worse with pizza   No cough or hoarseness  Clears throat a lot  No breathing problems   No nsaids right no w  Took some tums yesterday - helped for  few minutes   One coffee per day   Patient Active Problem List   Diagnosis Date Noted   GERD (gastroesophageal reflux disease) 10/10/2022   Ingrown  left greater toenail 07/20/2022   Routine general medical examination at a health care facility 06/08/2022   Allergic rhinitis 05/18/2011   Past Medical History:  Diagnosis Date   Belching    "alot" (03/18/2015)   Past Surgical History:  Procedure Laterality Date   INCISION AND DRAINAGE PERIRECTAL ABSCESS N/A 03/18/2015   Procedure: EUA/Ridge Sigmoidscopy/Drainage of pelvic abscess;  Surgeon: Jimmye Norman, MD;  Location: [facility] OR;  Service: General;  Laterality: N/A;   TRANSRECTAL DRAINAGE OF PELVIC ABSCESS  03/18/2015   Social History   Tobacco Use   Smoking status: Never   Smokeless tobacco: Never  Vaping Use   Vaping Use: Never used  Substance Use Topics   Alcohol use: No   Drug use: No   Family History  Problem Relation Age of Onset   Diabetes Other        MGGF   Other Other        heart problem- PGGM   No Known  Allergies Current Outpatient Medications on File Prior to Visit  Medication Sig Dispense Refill   acetaminophen (TYLENOL) 325 MG tablet Take 650 mg by mouth every 6 (six) hours as needed for mild pain.     calcium carbonate (TUMS - DOSED IN MG ELEMENTAL CALCIUM) 500 MG chewable tablet Chew 2 tablets by mouth daily as needed for indigestion or heartburn.     ibuprofen (ADVIL) 600 MG tablet Take 600 mg by mouth every 6 (six) hours as needed.     No current facility-administered medications on file prior to visit.    Review of Systems  Constitutional:  Negative for activity change, appetite change, fatigue, fever and unexpected weight change.  HENT:  Negative for congestion, rhinorrhea, sore throat and trouble swallowing.   Eyes:  Negative for pain, redness, itching and visual disturbance.  Respiratory:  Negative for cough, chest tightness, shortness of breath and wheezing.   Cardiovascular:  Negative for chest pain and palpitations.  Gastrointestinal:  Negative for abdominal distention, abdominal pain, anal bleeding, blood in stool, constipation, diarrhea, nausea,  rectal pain and vomiting.       Heartburn Burping   Endocrine: Negative for cold intolerance, heat intolerance, polydipsia and polyuria.  Genitourinary:  Negative for difficulty urinating, dysuria, frequency and urgency.  Musculoskeletal:  Negative for arthralgias, joint swelling and myalgias.  Skin:  Negative for pallor and rash.  Neurological:  Negative for dizziness, tremors, weakness, numbness and headaches.  Hematological:  Negative for adenopathy. Does not bruise/bleed easily.  Psychiatric/Behavioral:  Negative for decreased concentration and dysphoric mood. The patient is not nervous/anxious.        Objective:   Physical Exam Constitutional:      General: He is not in acute distress.    Appearance: Normal appearance. He is well-developed. He is obese. He is not ill-appearing or diaphoretic.  HENT:     Head: Normocephalic and atraumatic.  Eyes:     General: No scleral icterus.    Conjunctiva/sclera: Conjunctivae normal.     Pupils: Pupils are equal, round, and reactive to light.  Cardiovascular:     Rate and Rhythm: Normal rate and regular rhythm.     Heart sounds: Normal heart sounds.  Pulmonary:     Effort: Pulmonary effort is normal. No respiratory distress.     Breath sounds: Normal breath sounds. No wheezing or rales.  Abdominal:     General: Bowel sounds are normal. There is no distension.     Palpations: Abdomen is soft. There is no mass.     Tenderness: There is no abdominal tenderness. There is no guarding or rebound.     Hernia: No hernia is present.  Musculoskeletal:     Cervical back: Normal range of motion and neck supple.     Right lower leg: No edema.     Left lower leg: No edema.  Lymphadenopathy:     Cervical: No cervical adenopathy.  Skin:    General: Skin is warm and dry.     Coloration: Skin is not jaundiced or pale.     Findings: No erythema.  Neurological:     Mental Status: He is alert.  Psychiatric:        Mood and Affect: Mood normal.            Assessment & Plan:   Problem List Items Addressed This Visit       Digestive   GERD (gastroesophageal reflux disease) - Primary    Pt c/o burning in chest with freq  belching Reassuring ER visit 12/8 Reviewed hospital records, lab results and studies in detail  Reassuring exam Disc dietary change and avoid late eating  Omeprazole 20 mg daily in am before food  Famotidine 20 mg at bedtime  Update if not starting to improve in a week or if worsening  F/u 4-6 wk  Will begin to reduce med after sympt improve Handouts given for GERD and food choices for GERD       Relevant Medications   calcium carbonate (TUMS - DOSED IN MG ELEMENTAL CALCIUM) 500 MG chewable tablet   omeprazole (PRILOSEC) 20 MG capsule   famotidine (PEPCID) 20 MG tablet

## 2022-10-10 NOTE — Patient Instructions (Addendum)
Watch your diet for things that worsen your symptoms  Also don't eat late at night   Take omeprazole 20 mg daily in the am, at least 30 minutes before you eat   Take famotidine 20 mg at bedtime   Follow up in 4-6 weeks  If symptoms worsen or change let me know

## 2022-10-10 NOTE — Assessment & Plan Note (Signed)
Pt c/o burning in chest with freq belching Reassuring ER visit 12/8 Reviewed hospital records, lab results and studies in detail  Reassuring exam Disc dietary change and avoid late eating  Omeprazole 20 mg daily in am before food  Famotidine 20 mg at bedtime  Update if not starting to improve in a week or if worsening  F/u 4-6 wk  Will begin to reduce med after sympt improve Handouts given for GERD and food choices for GERD

## 2022-10-12 ENCOUNTER — Inpatient Hospital Stay: Payer: 59 | Admitting: Family Medicine

## 2022-10-31 ENCOUNTER — Telehealth: Payer: Self-pay | Admitting: Family Medicine

## 2022-10-31 DIAGNOSIS — K219 Gastro-esophageal reflux disease without esophagitis: Secondary | ICD-10-CM

## 2022-10-31 NOTE — Telephone Encounter (Signed)
Pt called wants to know if he needs to keep appointment with PCP . Please advise [phone]

## 2022-10-31 NOTE — Telephone Encounter (Signed)
Pt advised of Dr. Marliss Coots comments. Pt said he doesn't have any new sxs meds are just not helping sxs he discussed with PCP. Pt said he is okay with GI referral either city is fine (whoever can see him 1st). Pt advised it can take up to 2 weeks to get a call but if he hasn't received a call in 2 weeks to let us know

## 2022-10-31 NOTE — Telephone Encounter (Signed)
Thanks for letting me know  I want to refer him to GI for further eval Let me know if that is ok and I will do that  Don't stop the medication for now   Any new symptoms or changes?

## 2022-10-31 NOTE — Telephone Encounter (Signed)
I put the ref in as urgent  Let us know if symptoms worsen in meantime

## 2022-10-31 NOTE — Telephone Encounter (Signed)
Pt mother called in for pt stated pt is at work no DPR in chart but wanted PCP to know  that RX omeprazole (PRILOSEC) 20 MG capsule  and famotidine (PEPCID) 20 MG tablet  is not working still having issues . Wants to know if pt should continue taking medication . Advise pt mother  I will let PCP know  to contact patient regarding concerns

## 2022-11-01 ENCOUNTER — Telehealth (INDEPENDENT_AMBULATORY_CARE_PROVIDER_SITE_OTHER): Payer: No Typology Code available for payment source | Admitting: Gastroenterology

## 2022-11-01 ENCOUNTER — Encounter: Payer: Self-pay | Admitting: Gastroenterology

## 2022-11-01 DIAGNOSIS — K219 Gastro-esophageal reflux disease without esophagitis: Secondary | ICD-10-CM | POA: Diagnosis not present

## 2022-11-01 MED ORDER — OMEPRAZOLE 40 MG PO CPDR: 40.0000 mg | DELAYED_RELEASE_CAPSULE | Freq: Two times a day (BID) | ORAL | 1 refills | Status: DC

## 2022-11-01 NOTE — Progress Notes (Signed)
Sherri Sear, MD [address]  [place]  [place], [place] [address]  Main: [phone]  Fax: [phone]    Gastroenterology Consultation Video Visit  Referring Provider:     Abner Greenspan, MD Primary Care Physician:  Tower, Wynelle Fanny, MD Primary Gastroenterologist:  Dr. Cephas Darby Reason for Consultation: GERD        HPI:   Mathew Martin is a 27 y.o. male referred by Dr. Glori Bickers, Wynelle Fanny, MD  for consultation & management of GERD  Virtual Visit Video Note  I connected with Mathew Martin on 11/01/22 at  8:30 AM EST by video and verified that I am speaking with the correct person using two identifiers.   I discussed the limitations, risks, security and privacy concerns of performing an evaluation and management service by video and the availability of in person appointments. I also discussed with the patient that there may be a patient responsible charge related to this service. The patient expressed understanding and agreed to proceed.  Location of the Patient: Home  Location of the provider: Home  Persons participating in the visit: Patient, patient's mom and provider   History of Present Illness:  Mathew Martin is a 27 year old Caucasian male with history of obesity presented with 3 weeks history of chest pressure, burping, belching, feeling gassy.  He initially went to the ER secondary to chest pain after eating a spicy chicken sandwich.  Cardiac etiology was ruled out.  Subsequently, he followed up with PCP on 12/13, who felt it was secondary to reflux, started him on omeprazole 20 mg before breakfast and Pepcid 20 mg at bedtime.  Patient states that he has not noticed any improvement in his symptoms.  He has been skipping his meals and lost few pounds.  He is avoiding dinner.  He has cut back on consuming fatty foods, greasy foods, not drinking sodas but drinks sweet tea.  He denies any difficulty swallowing, epigastric pain, upper abdominal bloating or  early satiety.  Denies any change in his bowel habits.  Reports having regular bowel movements.  No evidence of anemia, LFTs normal. Patient denies any history of asthma, denies any food allergies.  Does have seasonal allergies  NSAIDs: Ibuprofen as needed  Antiplts/Anticoagulants/Anti thrombotics: None  GI Procedures: None  Past Medical History:  Diagnosis Date   Belching    "alot" (03/18/2015)    Past Surgical History:  Procedure Laterality Date   INCISION AND DRAINAGE PERIRECTAL ABSCESS N/A 03/18/2015   Procedure: EUA/Ridge Sigmoidscopy/Drainage of pelvic abscess;  Surgeon: Judeth Horn, MD;  Location: [facility];  Service: General;  Laterality: N/A;   TRANSRECTAL DRAINAGE OF PELVIC ABSCESS  03/18/2015     Current Outpatient Medications:    calcium carbonate (TUMS - DOSED IN MG ELEMENTAL CALCIUM) 500 MG chewable tablet, Chew 2 tablets by mouth daily as needed for indigestion or heartburn., Disp: , Rfl:    famotidine (PEPCID) 20 MG tablet, Take 1 tablet (20 mg total) by mouth at bedtime., Disp: 30 tablet, Rfl: 2   ibuprofen (ADVIL) 600 MG tablet, Take 600 mg by mouth every 6 (six) hours as needed., Disp: , Rfl:    acetaminophen (TYLENOL) 325 MG tablet, Take 650 mg by mouth every 6 (six) hours as needed for mild pain., Disp: , Rfl:    omeprazole (PRILOSEC) 40 MG capsule, Take 1 capsule (40 mg total) by mouth 2 (two) times daily before a meal. In am at least 30 minutes before eating, Disp: 60 capsule,  Rfl: 1   Family History  Problem Relation Age of Onset   Diabetes Other        MGGF   Other Other        heart problem- PGGM     Social History   Tobacco Use   Smoking status: Never   Smokeless tobacco: Never  Vaping Use   Vaping Use: Never used  Substance Use Topics   Alcohol use: No   Drug use: No    Allergies as of 11/01/2022   (No Known Allergies)     Imaging Studies: Reviewed  Assessment and Plan:   Mathew Martin is a 27 y.o. Caucasian male with obesity is  seen in consultation for 3 months history of chest pressure, frequent belching, burping, currently on low-dose PPI.  Cardiac etiology has been ruled out.  His symptoms are likely suggestive of severe acid reflux given his eating habits  Increase omeprazole to 40 mg twice daily before breakfast and before dinner Okay to stop Pepcid Discussed about antireflux lifestyle, information sent via MyChart Patient will reach out to me via MyChart or call my office if his symptoms are persistent despite taking high-dose omeprazole for 2 weeks.  Will then proceed with upper endoscopy.  Patient and his mom are both agreeable with the plan   Follow Up Instructions:   I discussed the assessment and treatment plan with the patient. The patient was provided an opportunity to ask questions and all were answered. The patient agreed with the plan and demonstrated an understanding of the instructions.   The patient was advised to call back or seek an in-person evaluation if the symptoms worsen or if the condition fails to improve as anticipated.  I provided 20 minutes of face-to-face time during this encounter.   Follow up as needed, contact via MyChart   Cephas Darby, MD

## 2022-11-05 ENCOUNTER — Ambulatory Visit: Payer: 59 | Admitting: Family Medicine

## 2022-11-16 ENCOUNTER — Ambulatory Visit: Payer: 59 | Admitting: Family Medicine

## 2022-11-30 ENCOUNTER — Other Ambulatory Visit: Payer: Self-pay

## 2022-11-30 ENCOUNTER — Telehealth: Payer: Self-pay | Admitting: Gastroenterology

## 2022-11-30 DIAGNOSIS — R1013 Epigastric pain: Secondary | ICD-10-CM

## 2022-11-30 DIAGNOSIS — K219 Gastro-esophageal reflux disease without esophagitis: Secondary | ICD-10-CM

## 2022-11-30 NOTE — Telephone Encounter (Signed)
Patient has been taking Omeprazole 40mg  2 times daily and has not been helping his symptoms. He is wanting to have a EGD done. Can we schedule this. He has a follow up appointment schedule for you on 02/26/2023

## 2022-11-30 NOTE — Telephone Encounter (Signed)
Patients mom called stating that the patient had a mychart visit with Dr. Marius Ditch and she had prescribed him a medication to help with reflux and that if it didn't work that she recommended an upper endoscopy. Patients mother is wondering what the next steps need to be.

## 2022-11-30 NOTE — Telephone Encounter (Signed)
Tried to call patient unable to leave a message because voicemail is not set up. Called patient mom and got EGD schedule for 12/13/2022 in [place]. Went over instructions, sent them to [facility]

## 2022-12-05 ENCOUNTER — Telehealth: Payer: Self-pay

## 2022-12-05 NOTE — Telephone Encounter (Signed)
Patient mother is calling because she states patient insurance will not cover the omeprazole 40mg  twice a day now. She states his grandmother got him over the counter omeprazole 20mg  and they want to know how he should take the medication . Called the pharmacy and they stated the insurance will only cover 90 pills in a 365 days per insurance with out a PA. They said we needed to call [phone] BIN [id number] and ID number [id number]. Submitted PA through cover my meds waiting on response from insurance company

## 2022-12-05 NOTE — Telephone Encounter (Signed)
Insurance company approved the omeprazole 40mg  twice daily. It is good from 12/05/2022 to 12/06/23

## 2022-12-10 ENCOUNTER — Encounter: Payer: Self-pay | Admitting: Gastroenterology

## 2022-12-13 ENCOUNTER — Encounter
Admission: RE | Disposition: A | Discharge status: Home / Self Care | Payer: Self-pay | Source: Ambulatory Visit | Attending: Gastroenterology

## 2022-12-13 ENCOUNTER — Ambulatory Visit: Payer: No Typology Code available for payment source | Admitting: Anesthesiology

## 2022-12-13 ENCOUNTER — Other Ambulatory Visit: Payer: Self-pay

## 2022-12-13 ENCOUNTER — Encounter: Payer: Self-pay | Admitting: Gastroenterology

## 2022-12-13 ENCOUNTER — Ambulatory Visit
Admission: RE | Admit: 2022-12-13 | Discharge: 2022-12-13 | Disposition: A | Discharge status: Home / Self Care | Payer: No Typology Code available for payment source | Source: Ambulatory Visit | Attending: Gastroenterology | Admitting: Gastroenterology

## 2022-12-13 DIAGNOSIS — K3189 Other diseases of stomach and duodenum: Secondary | ICD-10-CM | POA: Diagnosis not present

## 2022-12-13 DIAGNOSIS — K2100 Gastro-esophageal reflux disease with esophagitis, without bleeding: Secondary | ICD-10-CM | POA: Diagnosis not present

## 2022-12-13 DIAGNOSIS — K219 Gastro-esophageal reflux disease without esophagitis: Secondary | ICD-10-CM | POA: Diagnosis not present

## 2022-12-13 DIAGNOSIS — R0789 Other chest pain: Secondary | ICD-10-CM | POA: Diagnosis not present

## 2022-12-13 DIAGNOSIS — K319 Disease of stomach and duodenum, unspecified: Secondary | ICD-10-CM

## 2022-12-13 HISTORY — DX: Gastro-esophageal reflux disease without esophagitis: K21.9

## 2022-12-13 HISTORY — PX: ESOPHAGOGASTRODUODENOSCOPY (EGD) WITH PROPOFOL: SHX5813

## 2022-12-13 SURGERY — ESOPHAGOGASTRODUODENOSCOPY (EGD) WITH PROPOFOL
Anesthesia: General | Site: Esophagus

## 2022-12-13 MED ORDER — GLYCOPYRROLATE 0.2 MG/ML IJ SOLN
INTRAMUSCULAR | Status: DC | PRN
  Administered 2022-12-13: .2 mg via INTRAVENOUS

## 2022-12-13 MED ORDER — STERILE WATER FOR IRRIGATION IR SOLN
Status: DC | PRN
  Administered 2022-12-13: 1

## 2022-12-13 MED ORDER — PROPOFOL 10 MG/ML IV BOLUS
INTRAVENOUS | Status: DC | PRN
  Administered 2022-12-13: 50 mg via INTRAVENOUS
  Administered 2022-12-13: 100 mg via INTRAVENOUS
  Administered 2022-12-13: 30 mg via INTRAVENOUS

## 2022-12-13 MED ORDER — LACTATED RINGERS IV SOLN: INTRAVENOUS | Status: DC

## 2022-12-13 MED ORDER — SODIUM CHLORIDE 0.9 % IV SOLN: INTRAVENOUS | Status: DC

## 2022-12-13 SURGICAL SUPPLY — 32 items

## 2022-12-13 NOTE — H&P (Signed)
Mathew Darby, MD [address]  [place]  [place], [place] [address]  Main: [phone]  Fax: [phone] Pager: [phone]  Primary Care Physician:  Tower, Wynelle Fanny, MD Primary Gastroenterologist:  Dr. Cephas Martin  Pre-Procedure History & Physical: HPI:  Mathew Martin is a 27 y.o. male is here for an endoscopy.   Past Medical History:  Diagnosis Date   Belching    "alot" (03/18/2015)   GERD (gastroesophageal reflux disease)     Past Surgical History:  Procedure Laterality Date   INCISION AND DRAINAGE PERIRECTAL ABSCESS N/A 03/18/2015   Procedure: EUA/Ridge Sigmoidscopy/Drainage of pelvic abscess;  Surgeon: Judeth Horn, MD;  Location: [facility];  Service: General;  Laterality: N/A;   TRANSRECTAL DRAINAGE OF PELVIC ABSCESS  03/18/2015    Prior to Admission medications   Medication Sig Start Date End Date Taking? Authorizing Provider  acetaminophen (TYLENOL) 325 MG tablet Take 650 mg by mouth every 6 (six) hours as needed for mild pain.   Yes [provider]  calcium carbonate (TUMS - DOSED IN MG ELEMENTAL CALCIUM) 500 MG chewable tablet Chew 2 tablets by mouth daily as needed for indigestion or heartburn.   Yes [provider]  omeprazole (PRILOSEC) 20 MG capsule Take 20 mg by mouth daily.   Yes [provider]  famotidine (PEPCID) 20 MG tablet Take 1 tablet (20 mg total) by mouth at bedtime. Patient not taking: Reported on 12/10/2022 10/10/22   Tower, Wynelle Fanny, MD  omeprazole (PRILOSEC) 40 MG capsule Take 1 capsule (40 mg total) by mouth 2 (two) times daily before a meal. In am at least 30 minutes before eating Patient not taking: Reported on 12/10/2022 11/01/22 12/31/22  Lin Landsman, MD    Allergies as of 11/30/2022   (No Known Allergies)    Family History  Problem Relation Age of Onset   Diabetes Other        MGGF   Other Other        heart problem- PGGM    Social History   Socioeconomic History   Marital status: Single     Spouse name: Not on file   Number of children: Not on file   Years of education: Not on file   Highest education level: Not on file  Occupational History   Not on file  Tobacco Use   Smoking status: Never   Smokeless tobacco: Never  Vaping Use   Vaping Use: Never used  Substance and Sexual Activity   Alcohol use: No   Drug use: No   Sexual activity: Not on file  Other Topics Concern   Not on file  Social History Narrative   Lives with parents and siblings   No smoking in the house   Social Determinants of Health   Financial Resource Strain: Not on file  Food Insecurity: Not on file  Transportation Needs: Not on file  Physical Activity: Not on file  Stress: Not on file  Social Connections: Not on file  Intimate Partner Violence: Not on file    Review of Systems: See HPI, otherwise negative ROS  Physical Exam: BP 135/82   Pulse 68   Temp 98.7 F (37.1 C) (Temporal)   Ht 6' 3.5" (1.918 m)   Wt 103.4 kg   SpO2 100%   BMI 28.12 kg/m  General:   Alert,  pleasant and cooperative in NAD Head:  Normocephalic and atraumatic. Neck:  Supple; no masses or thyromegaly. Lungs:  Clear throughout to auscultation.  Heart:  Regular rate and rhythm. Abdomen:  Soft, nontender and nondistended. Normal bowel sounds, without guarding, and without rebound.   Neurologic:  Alert and  oriented x4;  grossly normal neurologically.  Impression/Plan: Mathew Martin is here for an endoscopy to be performed for 3 months history of chest pressure, frequent belching, burping   Risks, benefits, limitations, and alternatives regarding  endoscopy have been reviewed with the patient.  Questions have been answered.  All parties agreeable.   Sherri Sear, MD  12/13/2022, 7:37 AM

## 2022-12-13 NOTE — Transfer of Care (Signed)
Immediate Anesthesia Transfer of Care Note  Patient: Mathew Martin  Procedure(s) Performed: ESOPHAGOGASTRODUODENOSCOPY (EGD) WITH PROPOFOL (Esophagus)  Patient Location: PACU  Anesthesia Type: General  Level of Consciousness: awake, alert  and patient cooperative  Airway and Oxygen Therapy: Patient Spontanous Breathing and Patient connected to supplemental oxygen  Post-op Assessment: Post-op Vital signs reviewed, Patient's Cardiovascular Status Stable, Respiratory Function Stable, Patent Airway and No signs of Nausea or vomiting  Post-op Vital Signs: Reviewed and stable  Complications: No notable events documented.

## 2022-12-13 NOTE — Anesthesia Postprocedure Evaluation (Signed)
Anesthesia Post Note  Patient: Mathew Martin  Procedure(s) Performed: ESOPHAGOGASTRODUODENOSCOPY (EGD) WITH PROPOFOL (Esophagus)  Patient location during evaluation: PACU Anesthesia Type: General Level of consciousness: awake and alert Pain management: pain level controlled Vital Signs Assessment: post-procedure vital signs reviewed and stable Respiratory status: spontaneous breathing, nonlabored ventilation and respiratory function stable Cardiovascular status: blood pressure returned to baseline and stable Postop Assessment: no apparent nausea or vomiting Anesthetic complications: no   No notable events documented.   Last Vitals:  Vitals:   12/13/22 0830 12/13/22 0832  BP: 107/74   Pulse: 77 64  Resp: (!) 22 10  Temp:    SpO2: 100% 96%    Last Pain:  Vitals:   12/13/22 0713  TempSrc: Temporal                 [place] Ouch

## 2022-12-13 NOTE — Op Note (Signed)
[facility] Gastroenterology Patient Name: Mathew Martin Procedure Date: 12/13/2022 7:58 AM MRN: [id number] Account #: [id number] Date of Birth: 1996-09-25 Admit Type: Outpatient Age: 27 Room: [facility] OR ROOM 01 Gender: Male Note Status: Finalized Instrument Name: [id number] Procedure:             Upper GI endoscopy Indications:           Esophageal reflux symptoms that persist despite                         appropriate therapy, Chest pain (non cardiac) Providers:             Lin Landsman MD, MD Referring MD:          Wynelle Fanny. Tower (Referring MD) Medicines:             General Anesthesia Complications:         No immediate complications. Estimated blood loss: None. Procedure:             Pre-Anesthesia Assessment:                        - Prior to the procedure, a History and Physical was                         performed, and patient medications and allergies were                         reviewed. The patient is competent. The risks and                         benefits of the procedure and the sedation options and                         risks were discussed with the patient. All questions                         were answered and informed consent was obtained.                         Patient identification and proposed procedure were                         verified by the physician, the nurse, the                         anesthesiologist, the anesthetist and the technician                         in the pre-procedure area in the procedure room in the                         endoscopy suite. Mental Status Examination: alert and                         oriented. Airway Examination: normal oropharyngeal                         airway and neck mobility. Respiratory Examination:  clear to auscultation. CV Examination: normal.                         Prophylactic Antibiotics: The patient does not require                          prophylactic antibiotics. Prior Anticoagulants: The                         patient has taken no anticoagulant or antiplatelet                         agents. ASA Grade Assessment: I - A normal, healthy                         patient. After reviewing the risks and benefits, the                         patient was deemed in satisfactory condition to                         undergo the procedure. The anesthesia plan was to use                         general anesthesia. Immediately prior to                         administration of medications, the patient was                         re-assessed for adequacy to receive sedatives. The                         heart rate, respiratory rate, oxygen saturations,                         blood pressure, adequacy of pulmonary ventilation, and                         response to care were monitored throughout the                         procedure. The physical status of the patient was                         re-assessed after the procedure.                        After obtaining informed consent, the endoscope was                         passed under direct vision. Throughout the procedure,                         the patient's blood pressure, pulse, and oxygen                         saturations were monitored continuously. The  Colonoscope was introduced through the mouth, and                         advanced to the second part of duodenum. The upper GI                         endoscopy was accomplished without difficulty. The                         patient tolerated the procedure well. Findings:      The duodenal bulb and second portion of the duodenum were normal.      Diffuse mildly erythematous mucosa without bleeding was found in the       gastric fundus and in the gastric body. Biopsies were taken with a cold       forceps for Helicobacter pylori testing.      The incisura and gastric antrum were normal. Biopsies were  taken with a       cold forceps for Helicobacter pylori testing.      The cardia and gastric fundus were normal on retroflexion.      Esophagogastric landmarks were identified: the gastroesophageal junction       was found at 38 cm from the incisors.      The gastroesophageal junction and examined esophagus were normal.       Biopsies were taken with a cold forceps for histology. Impression:            - Normal duodenal bulb and second portion of the                         duodenum.                        - Erythematous mucosa in the gastric fundus and                         gastric body. Biopsied.                        - Normal incisura and antrum. Biopsied.                        - Esophagogastric landmarks identified.                        - Normal gastroesophageal junction and esophagus.                         Biopsied. Recommendation:        - Await pathology results.                        - Discharge patient to home (with escort).                        - Resume previous diet today.                        - Continue present medications. Procedure Code(s):     --- Professional ---  [id number], Esophagogastroduodenoscopy, flexible,                         transoral; with biopsy, single or multiple Diagnosis Code(s):     --- Professional ---                        K31.89, Other diseases of stomach and duodenum                        K21.9, Gastro-esophageal reflux disease without                         esophagitis                        R07.89, Other chest pain CPT copyright 2022 American Medical Association. All rights reserved. The codes documented in this report are preliminary and upon coder review may  be revised to meet current compliance requirements. Dr. Ulyess Mort Lin Landsman MD, MD 12/13/2022 8:19:25 AM This report has been signed electronically. Number of Addenda: 0 Note Initiated On: 12/13/2022 7:58 AM Total Procedure Duration: 0 hours  5 minutes 28 seconds  Estimated Blood Loss:  Estimated blood loss: none.      [facility]

## 2022-12-13 NOTE — Anesthesia Preprocedure Evaluation (Signed)
Anesthesia Evaluation  Patient identified by MRN, date of birth, ID band Patient awake    Reviewed: Allergy & Precautions, H&P , NPO status , Patient's Chart, lab work & pertinent test results  Airway Mallampati: II  TM Distance: >3 FB Neck ROM: full    Dental no notable dental hx.    Pulmonary neg pulmonary ROS   Pulmonary exam normal        Cardiovascular negative cardio ROS Normal cardiovascular exam     Neuro/Psych negative neurological ROS  negative psych ROS   GI/Hepatic Neg liver ROS,GERD  ,,  Endo/Other  negative endocrine ROS    Renal/GU negative Renal ROS  negative genitourinary   Musculoskeletal   Abdominal Normal abdominal exam  (+)   Peds  Hematology negative hematology ROS (+)   Anesthesia Other Findings Past Medical History: No date: Belching     Comment:  "alot" (03/18/2015) No date: GERD (gastroesophageal reflux disease)  Past Surgical History: 03/18/2015: INCISION AND DRAINAGE PERIRECTAL ABSCESS; N/A     Comment:  Procedure: EUA/Ridge Sigmoidscopy/Drainage of pelvic               abscess;  Surgeon: Judeth Horn, MD;  Location: [facility];                Service: General;  Laterality: N/A; 03/18/2015: TRANSRECTAL DRAINAGE OF PELVIC ABSCESS  BMI    Body Mass Index: 28.12 kg/m      Reproductive/Obstetrics negative OB ROS                             Anesthesia Physical Anesthesia Plan  ASA: 1  Anesthesia Plan: General   Post-op Pain Management: Minimal or no pain anticipated   Induction: Intravenous  PONV Risk Score and Plan: Propofol infusion and TIVA  Airway Management Planned: Natural Airway  Additional Equipment:   Intra-op Plan:   Post-operative Plan:   Informed Consent: I have reviewed the patients History and Physical, chart, labs and discussed the procedure including the risks, benefits and alternatives for the proposed anesthesia with the patient or  authorized representative who has indicated his/her understanding and acceptance.     Dental Advisory Given  Plan Discussed with: CRNA and Surgeon  Anesthesia Plan Comments:         Anesthesia Quick Evaluation

## 2022-12-14 ENCOUNTER — Encounter: Payer: Self-pay | Admitting: Gastroenterology

## 2022-12-14 LAB — SURGICAL PATHOLOGY

## 2023-01-07 ENCOUNTER — Ambulatory Visit
Admission: RE | Admit: 2023-01-07 | Discharge: 2023-01-07 | Disposition: A | Discharge status: Home / Self Care | Payer: No Typology Code available for payment source | Source: Ambulatory Visit | Attending: Gastroenterology | Admitting: Gastroenterology

## 2023-01-07 DIAGNOSIS — R1013 Epigastric pain: Secondary | ICD-10-CM | POA: Insufficient documentation

## 2023-01-08 ENCOUNTER — Telehealth: Payer: Self-pay

## 2023-01-08 DIAGNOSIS — K219 Gastro-esophageal reflux disease without esophagitis: Secondary | ICD-10-CM

## 2023-01-08 DIAGNOSIS — R1013 Epigastric pain: Secondary | ICD-10-CM

## 2023-01-08 DIAGNOSIS — R1010 Upper abdominal pain, unspecified: Secondary | ICD-10-CM

## 2023-01-08 NOTE — Telephone Encounter (Signed)
-----   Message from Lin Landsman, MD sent at 01/08/2023  8:06 AM EDT ----- Normal RUQ ultra sound, recommend food allergy profile and alpha gal panel and HIDA scan  RV

## 2023-01-08 NOTE — Telephone Encounter (Signed)
Order blood work and [employer] scan. Called central scheduling and got patient schedule for 01/16/2023 [place] [facility] Nothing to eat or drink after midnight. Tried to call patient but voicemail is not set up on primary number. Called home number and left a message for call back.

## 2023-01-08 NOTE — Telephone Encounter (Signed)
Sent mychart message to patient

## 2023-01-16 ENCOUNTER — Encounter
Admission: RE | Admit: 2023-01-16 | Discharge: 2023-01-16 | Disposition: A | Discharge status: Home / Self Care | Payer: No Typology Code available for payment source | Source: Ambulatory Visit | Attending: Gastroenterology | Admitting: Gastroenterology

## 2023-01-16 DIAGNOSIS — R1011 Right upper quadrant pain: Secondary | ICD-10-CM | POA: Diagnosis not present

## 2023-01-16 DIAGNOSIS — K219 Gastro-esophageal reflux disease without esophagitis: Secondary | ICD-10-CM | POA: Insufficient documentation

## 2023-01-16 DIAGNOSIS — R1013 Epigastric pain: Secondary | ICD-10-CM | POA: Insufficient documentation

## 2023-01-16 MED ORDER — TECHNETIUM TC 99M MEBROFENIN IV KIT
5.3800 | PACK | Freq: Once | INTRAVENOUS | Status: AC | PRN
  Administered 2023-01-16: 5.38 via INTRAVENOUS

## 2023-01-21 LAB — ALPHA-GAL PANEL
Allergen Lamb IgE: <0.1 kU/L
Beef IgE: <0.1 kU/L
IgE (Immunoglobulin E), Serum: <2 IU/mL — ABNORMAL LOW (ref 6–495)
O215-IgE Alpha-Gal: <0.1 kU/L
Pork IgE: <0.1 kU/L

## 2023-01-21 LAB — FOOD ALLERGY PROFILE
Allergen Corn, IgE: <0.1 kU/L
Clam IgE: <0.1 kU/L
Codfish IgE: <0.1 kU/L
Egg White IgE: <0.1 kU/L
Milk IgE: <0.1 kU/L
Peanut IgE: <0.1 kU/L
Scallop IgE: <0.1 kU/L
Sesame Seed IgE: <0.1 kU/L
Shrimp IgE: <0.1 kU/L
Soybean IgE: <0.1 kU/L
Walnut IgE: <0.1 kU/L
Wheat IgE: <0.1 kU/L

## 2023-01-30 ENCOUNTER — Encounter (HOSPITAL_COMMUNITY): Payer: Self-pay

## 2023-01-30 ENCOUNTER — Telehealth: Payer: Self-pay | Admitting: Gastroenterology

## 2023-01-30 ENCOUNTER — Emergency Department (HOSPITAL_COMMUNITY)
Admission: EM | Admit: 2023-01-30 | Discharge: 2023-01-30 | Disposition: A | Discharge status: Home / Self Care | Payer: No Typology Code available for payment source | Attending: Emergency Medicine | Admitting: Emergency Medicine

## 2023-01-30 DIAGNOSIS — R1013 Epigastric pain: Secondary | ICD-10-CM | POA: Diagnosis present

## 2023-01-30 DIAGNOSIS — K219 Gastro-esophageal reflux disease without esophagitis: Secondary | ICD-10-CM

## 2023-01-30 LAB — COMPREHENSIVE METABOLIC PANEL
ALT: 23 U/L (ref 0–44)
AST: 21 U/L (ref 15–41)
Albumin: 4.7 g/dL (ref 3.5–5.0)
Alkaline Phosphatase: 56 U/L (ref 38–126)
Anion gap: 7 (ref 5–15)
BUN: 16 mg/dL (ref 6–20)
CO2: 26 mmol/L (ref 22–32)
Calcium: 9.1 mg/dL (ref 8.9–10.3)
Chloride: 104 mmol/L (ref 98–111)
Creatinine, Ser: 0.94 mg/dL (ref 0.61–1.24)
GFR, Estimated: >60 mL/min (ref >60)
Glucose, Bld: 86 mg/dL (ref 70–99)
Potassium: 4.1 mmol/L (ref 3.5–5.1)
Sodium: 137 mmol/L (ref 135–145)
Total Bilirubin: 0.7 mg/dL (ref 0.3–1.2)
Total Protein: 6.9 g/dL (ref 6.5–8.1)

## 2023-01-30 LAB — URINALYSIS, ROUTINE W REFLEX MICROSCOPIC
Bilirubin Urine: NEGATIVE
Glucose, UA: NEGATIVE mg/dL
Hgb urine dipstick: NEGATIVE
Ketones, ur: NEGATIVE mg/dL
Leukocytes,Ua: NEGATIVE
Nitrite: NEGATIVE
Protein, ur: NEGATIVE mg/dL
Specific Gravity, Urine: 1.016 (ref 1.005–1.030)
pH: 6 (ref 5.0–8.0)

## 2023-01-30 LAB — CBC
HCT: 41.7 % (ref 39.0–52.0)
Hemoglobin: 14.2 g/dL (ref 13.0–17.0)
MCH: 28.7 pg (ref 26.0–34.0)
MCHC: 34.1 g/dL (ref 30.0–36.0)
MCV: 84.4 fL (ref 80.0–100.0)
Platelets: 150 10*3/uL (ref 150–400)
RBC: 4.94 MIL/uL (ref 4.22–5.81)
RDW: 12.7 % (ref 11.5–15.5)
WBC: 6.1 10*3/uL (ref 4.0–10.5)
nRBC: 0 % (ref 0.0–0.2)

## 2023-01-30 LAB — LIPASE, BLOOD: Lipase: 37 U/L (ref 11–51)

## 2023-01-30 MED ORDER — SUCRALFATE 1 G PO TABS: 1.0000 g | ORAL_TABLET | Freq: Three times a day (TID) | ORAL | 0 refills | Status: DC

## 2023-01-30 NOTE — Discharge Instructions (Signed)
Your lab work and exam today are reassuring, if you are continuing to have persistent epigastric pain despite being treated for acid reflux I would follow-up with your GI doctor, there is only so much testing that we are able to do in the emergency department especially when symptoms are chronic in nature, you can try the carafate tablets in addition to prilosec, tums, maalox mylanta. I have attached some food choices that may  help with your symptoms if you are having GERD related pain.

## 2023-01-30 NOTE — ED Provider Notes (Signed)
[place] Provider Note   CSN: [id number] Arrival date & time: 01/30/23  1101     History  Chief Complaint  Patient presents with   Abdominal Pain    Mathew Martin is a 27 y.o. male with past medical history significant for known GERD who presents with concern for ongoing intermittent epigastric abdominal pain since December.  He denies any nausea, vomiting, diarrhea.  Patient reports that he had an EGD which was unremarkable, he has been seen previously with no significant symptoms.  Patient reports he is not having severe pain today but occasionally does.  He reports it is worse at night.  He denies significant worsening after eating.  He denies any dark, tarry stool.  He denies any heavy alcohol or NSAID use.   Abdominal Pain      Home Medications Prior to Admission medications   Medication Sig Start Date End Date Taking? Authorizing Provider  sucralfate (CARAFATE) 1 g tablet Take 1 tablet (1 g total) by mouth in the morning, at noon, and at bedtime. As needed 01/30/23  Yes Manya Balash H, PA-C  acetaminophen (TYLENOL) 325 MG tablet Take 650 mg by mouth every 6 (six) hours as needed for mild pain.    [provider]  calcium carbonate (TUMS - DOSED IN MG ELEMENTAL CALCIUM) 500 MG chewable tablet Chew 2 tablets by mouth daily as needed for indigestion or heartburn.    [provider]  omeprazole (PRILOSEC) 40 MG capsule Take 1 capsule (40 mg total) by mouth 2 (two) times daily before a meal. In am at least 30 minutes before eating Patient not taking: Reported on 12/10/2022 11/01/22 12/31/22  Lin Landsman, MD      Allergies    Patient has no known allergies.    Review of Systems   Review of Systems  Gastrointestinal:  Positive for abdominal pain.  All other systems reviewed and are negative.   Physical Exam Updated Vital Signs BP 131/77 (BP Location: Left Arm)   Pulse 70   Temp 98.2 F (36.8 C)    Resp 18   SpO2 100%  Physical Exam Vitals and nursing note reviewed.  Constitutional:      General: He is not in acute distress.    Appearance: Normal appearance.  HENT:     Head: Normocephalic and atraumatic.  Eyes:     General:        Right eye: No discharge.        Left eye: No discharge.  Cardiovascular:     Rate and Rhythm: Normal rate and regular rhythm.     Heart sounds: No murmur heard.    No friction rub. No gallop.  Pulmonary:     Effort: Pulmonary effort is normal.     Breath sounds: Normal breath sounds.  Abdominal:     General: Bowel sounds are normal.     Palpations: Abdomen is soft.     Comments: Patient with no significant ttp of abdomen. No rebound, rigidity, guarding throughout  Skin:    General: Skin is warm and dry.     Capillary Refill: Capillary refill takes less than 2 seconds.  Neurological:     Mental Status: He is alert and oriented to person, place, and time.  Psychiatric:        Mood and Affect: Mood normal.        Behavior: Behavior normal.     ED Results / Procedures / Treatments  Labs (all labs ordered are listed, but only abnormal results are displayed) Labs Reviewed  LIPASE, BLOOD  COMPREHENSIVE METABOLIC PANEL  CBC  URINALYSIS, ROUTINE W REFLEX MICROSCOPIC    EKG None  Radiology No results found.  Procedures Procedures    Medications Ordered in ED Medications - No data to display  ED Course/ Medical Decision Making/ A&P                             Medical Decision Making Amount and/or Complexity of Data Reviewed Labs: ordered.  Risk Prescription drug management.   This patient is a 27 y.o. male  who presents to the ED for concern of intermittent epigastric abdominal pain since December.   Differential diagnoses prior to evaluation: The emergent differential diagnosis includes, but is not limited to,  esophagitis, gastritis, peptic ulcer disease, esophageal rupture, gastric rupture, Boerhaave's,  Mallory-Weiss, pancreatitis, cholecystitis, cholangitis, acute mesenteric ischemia, atypical chest pain or ACS, lower lobar pneumonia versus other . This is not an exhaustive differential.   Past Medical History / Co-morbidities: Patient with previous diagnosis of GERD, previous EGD without any evidence of ulcers  Physical Exam: Physical exam performed. The pertinent findings include: Patient was stable vital signs, no significant tenderness palpation of the abdomen, no rebound, rigidity, guarding, he is in no acute distress today  Lab Tests/Imaging studies: I personally interpreted labs/imaging and the pertinent results include: CBC, CMP, lipase, UA are all unremarkable.  Medications: Encourage patient continue taking his Prilosec, he can use Tums as needed when he is having pain, discussed he can also try Carafate.  I discussed that if he is not having severe worsening of the pain there is not a significant amount that we are going to be able to determine in the emergency department for chronic abdominal pain and that he should follow-up with his gastroenterologist if he continues to have ongoing symptoms without resolution.  Disposition: After consideration of the diagnostic results and the patients response to treatment, I feel that patient is stable for dc with plan as above .   emergency department workup does not suggest an emergent condition requiring admission or immediate intervention beyond what has been performed at this time. The plan is: as above. The patient is safe for discharge and has been instructed to return immediately for worsening symptoms, change in symptoms or any other concerns.  Final Clinical Impression(s) / ED Diagnoses Final diagnoses:  Epigastric pain  Gastroesophageal reflux disease, unspecified whether esophagitis present    Rx / DC Orders ED Discharge Orders          Ordered    sucralfate (CARAFATE) 1 g tablet  3 times daily        01/30/23 1356               Devery Murgia, Elmore H, PA-C 01/30/23 1406    Regan Lemming, MD 01/30/23 2113

## 2023-01-30 NOTE — Telephone Encounter (Signed)
Patient is currently at [facility] ER. Called patient mom and she answered and was telling her who I was and the phone got disconnected. Called patient mom again and she said sorry about that she did not mean to hang up. Dr. Vicente Males called me about this patient because I had sent him the mychart message he states he is currently at ER and they will evaluate him and treat him and then Dr, Marius Ditch will be in the office on Monday and she will recommend further what she thinks is the best recommendations. Informed patient mom this information and she states he is not in the ER at this time. He only went to the ER when the symptoms first started. Informed her no it says he is check in currently to the ER at [facility]. She states okay she will call her son.

## 2023-01-30 NOTE — Telephone Encounter (Signed)
Patient mom requesting a call back from Dr Lance Morin assistant. States she left a Therapist, music. Also stated that the patient left work today in pain.

## 2023-01-30 NOTE — ED Triage Notes (Signed)
Pt presents with c/o abdominal pain that has been intermittent in nature since December. Pt denies any N/V/D. Pt reports he has had some testing done with no answers so far.

## 2023-01-30 NOTE — ED Provider Triage Note (Signed)
Emergency Medicine Provider Triage Evaluation Note  Mathew Martin , a 27 y.o. male  was evaluated in triage.  Pt complains of abdominal pain since December.  Pain is intermittent.  No nausea, vomiting, or diarrhea.  Review of Systems  Positive: Abd pain Negative: fever  Physical Exam  BP 131/77 (BP Location: Left Arm)   Pulse 70   Temp 98.2 F (36.8 C)   Resp 18   SpO2 100%  Gen:   Awake, no distress   Resp:  Normal effort  MSK:   Moves extremities without difficulty  Other:    Medical Decision Making  Medically screening exam initiated at 11:37 AM.  Appropriate orders placed.  Simmie Paules was informed that the remainder of the evaluation will be completed by another provider, this initial triage assessment does not replace that evaluation, and the importance of remaining in the ED until their evaluation is complete.  Abdominal labs   Karie Kirks 01/30/23 1137

## 2023-01-30 NOTE — Telephone Encounter (Signed)
Patient is currently at [facility] long ER

## 2023-01-31 ENCOUNTER — Telehealth: Payer: Self-pay

## 2023-01-31 NOTE — Transitions of Care (Post Inpatient/ED Visit) (Signed)
   01/31/2023  Name: Mathew Martin MRN: [id number] DOB: [birth year]  Today's TOC FU Call Status: Today's TOC FU Call Status:: Successful TOC FU Call Competed TOC FU Call Complete Date: 01/31/23  Transition Care Management Follow-up Telephone Call Date of Discharge: 01/30/23 Discharge Facility: [facility]) Type of Discharge: Emergency Department Reason for ED Visit: Other: (epigastric pain) How have you been since you were released from the hospital?: Better Any questions or concerns?: No  Items Reviewed: Did you receive and understand the discharge instructions provided?: Yes Medications obtained and verified?: Yes (Medications Reviewed) Any new allergies since your discharge?: No Dietary orders reviewed?: NA Do you have support at home?: Yes People in Home: parent(s)  Home Care and Equipment/Supplies: [place] Ordered?: NA Any new equipment or medical supplies ordered?: NA  Functional Questionnaire: Do you need assistance with bathing/showering or dressing?: No Do you need assistance with meal preparation?: No Do you need assistance with eating?: No Do you have difficulty maintaining continence: No Do you need assistance with getting out of bed/getting out of a chair/moving?: No Do you have difficulty managing or taking your medications?: No  Follow up appointments reviewed: PCP Follow-up appointment confirmed?: [facility] Follow-up appointment confirmed?: No Reason Specialist Follow-Up Not Confirmed: Patient has Specialist Provider Number and will Call for Appointment Do you need transportation to your follow-up appointment?: No Do you understand care options if your condition(s) worsen?: Yes-patient verbalized understanding    [place] Team Direct dial:  [phone]

## 2023-01-31 NOTE — Telephone Encounter (Signed)
When do you want to see patient

## 2023-01-31 NOTE — Telephone Encounter (Signed)
Noted Er visit with carafate and TUMS added , Follow up with Dr Marius Ditch when she is back

## 2023-02-06 NOTE — Progress Notes (Unsigned)
Patient ID: Mathew Martin, male   DOB: February 22, 1996, 27 y.o.   MRN: [id number]  Chief Complaint: Epigastric pain for 4 months  History of Present Illness Mathew Martin is a 27 y.o. male with sporadic epigastric pain with radiation into the left chest and left arm.  Typically occurs in the nighttime and had avoided the evening meal for some time which resulted in a degree of weight loss.  Attempts to narrow the spectrum of his diet has not provided the anticipated improvement.  Multiple modifications attempted without significant relief.  He has significant associated burping/belching, pyrosis as well with for which he is taking a PPI, and again not finding adequate resolution.  Diet is quite varied and sporadic from various sources of food from takeout to grandma's cooking.  Denies any fevers or chills.  Reports regular bowel activity, with at least daily or occasionally twice daily easy to move bowel habits. EGD was completed and negative, allergy testing also negative.  Right upper quadrant ultrasound negative for gallstones, nuclear HIDA scan with ejection fraction within normal limits.  Patient also reports no precipitation of pain with Ensure intake.  Past Medical History Past Medical History:  Diagnosis Date   Belching    "alot" (03/18/2015)   GERD (gastroesophageal reflux disease)       Past Surgical History:  Procedure Laterality Date   ESOPHAGOGASTRODUODENOSCOPY (EGD) WITH PROPOFOL N/A 12/13/2022   Procedure: ESOPHAGOGASTRODUODENOSCOPY (EGD) WITH PROPOFOL;  Surgeon: Toney ReilVanga, Rohini Reddy, MD;  [facility];  Service: Endoscopy;  Laterality: N/A;   INCISION AND DRAINAGE PERIRECTAL ABSCESS N/A 03/18/2015   Procedure: EUA/Ridge Sigmoidscopy/Drainage of pelvic abscess;  Surgeon: Jimmye NormanJames Wyatt, MD;  [facility];  Service: General;  Laterality: N/A;   TRANSRECTAL DRAINAGE OF PELVIC ABSCESS  03/18/2015    No Known Allergies  Current Outpatient Medications  Medication Sig  Dispense Refill   acetaminophen (TYLENOL) 325 MG tablet Take 650 mg by mouth every 6 (six) hours as needed for mild pain.     calcium carbonate (TUMS - DOSED IN MG ELEMENTAL CALCIUM) 500 MG chewable tablet Chew 2 tablets by mouth daily as needed for indigestion or heartburn.     omeprazole (PRILOSEC) 40 MG capsule Take 1 capsule (40 mg total) by mouth 2 (two) times daily before a meal. In am at least 30 minutes before eating 60 capsule 1   sucralfate (CARAFATE) 1 g tablet Take 1 tablet (1 g total) by mouth in the morning, at noon, and at bedtime. As needed (Patient not taking: Reported on 02/07/2023) 60 tablet 0   No current facility-administered medications for this visit.    Family History Family History  Problem Relation Age of Onset   Diabetes Other        MGGF   Other Other        heart problem- PGGM      Social History Social History   Tobacco Use   Smoking status: Never    Passive exposure: Never   Smokeless tobacco: Never  Vaping Use   Vaping Use: Never used  Substance Use Topics   Alcohol use: No   Drug use: No        Review of Systems  Constitutional:  Positive for weight loss.  HENT: Negative.    Eyes: Negative.   Respiratory: Negative.    Cardiovascular:  Positive for chest pain.  Gastrointestinal:  Positive for abdominal pain and heartburn.  Genitourinary: Negative.   Skin: Negative.   Neurological:  Positive for headaches.  Psychiatric/Behavioral: Negative.       Physical Exam Blood pressure 122/83, pulse 67, temperature 98 F (36.7 C), height 6\' 3"  (1.905 m), weight 221 lb (100.2 kg), SpO2 99 %. Last Weight  Most recent update: 02/07/2023  8:56 AM    Weight  100.2 kg (221 lb)             CONSTITUTIONAL: Well developed, and nourished, appropriately responsive and aware without distress.   EYES: Sclera non-icteric.   EARS, NOSE, MOUTH AND THROAT:  The oropharynx is clear. Oral mucosa is pink and moist.   Hearing is intact to voice.  NECK:  Trachea is midline, and there is no jugular venous distension.  LYMPH NODES:  Lymph nodes in the neck are not appreciated. RESPIRATORY:  Lungs are clear, and breath sounds are equal bilaterally.  Normal respiratory effort without pathologic use of accessory muscles. CARDIOVASCULAR: Heart is regular in rate and rhythm.  Well perfused.  GI: The abdomen is soft, nontender, and nondistended. There were no palpable masses.  I did not appreciate hepatosplenomegaly.  MUSCULOSKELETAL:  Symmetrical muscle tone appreciated in all four extremities.    SKIN: Skin turgor is normal. No pathologic skin lesions appreciated.  NEUROLOGIC:  Motor and sensation appear grossly normal.  Cranial nerves are grossly without defect. PSYCH:  Alert and oriented to person, place and time. Affect is appropriate for situation.  Data Reviewed I have personally reviewed what is currently available of the patient's imaging, recent labs and medical records.   Labs:     Latest Ref Rng & Units 01/30/2023   11:01 AM 10/04/2022    9:27 PM 06/08/2022    9:34 AM  CBC  WBC 4.0 - 10.5 K/uL 6.1  7.3  4.9   Hemoglobin 13.0 - 17.0 g/dL 85.0  27.7  41.2   Hematocrit 39.0 - 52.0 % 41.7  42.4  42.5   Platelets 150 - 400 K/uL 150  164  135.0       Latest Ref Rng & Units 01/30/2023   11:01 AM 10/04/2022    9:27 PM 06/08/2022    9:34 AM  CMP  Glucose 70 - 99 mg/dL 86  878  68   BUN 6 - 20 mg/dL 16  17  14    Creatinine 0.61 - 1.24 mg/dL 6.76  7.20  9.47   Sodium 135 - 145 mmol/L 137  140  140   Potassium 3.5 - 5.1 mmol/L 4.1  3.6  4.0   Chloride 98 - 111 mmol/L 104  104  104   CO2 22 - 32 mmol/L 26  24  31    Calcium 8.9 - 10.3 mg/dL 9.1  9.3  9.2   Total Protein 6.5 - 8.1 g/dL 6.9   6.7   Total Bilirubin 0.3 - 1.2 mg/dL 0.7   0.6   Alkaline Phos 38 - 126 U/L 56   58   AST 15 - 41 U/L 21   19   ALT 0 - 44 U/L 23   23     Imaging: Radiological images reviewed:  CLINICAL DATA:  Dyspepsia   EXAM: ULTRASOUND ABDOMEN LIMITED RIGHT  UPPER QUADRANT   COMPARISON:  None Available.   FINDINGS: Gallbladder:   No gallstones or wall thickening visualized. No sonographic Murphy sign noted by sonographer.   Common bile duct:   Diameter: Visualized portion measures 3 mm, within normal limits.   Liver:   No focal lesion identified. Within normal limits in parenchymal echogenicity. Portal vein is  patent on color Doppler imaging with normal direction of blood flow towards the liver.   Other: None.   IMPRESSION: No sonographic etiology for dyspepsia identified.     Electronically Signed   By: Meda Klinefelter M.D.   On: 01/07/2023 10:15  CLINICAL DATA:  Right upper quadrant abdominal pain   EXAM: NUCLEAR MEDICINE HEPATOBILIARY IMAGING WITH GALLBLADDER EF   TECHNIQUE: Sequential images of the abdomen were obtained out to 60 minutes following intravenous administration of radiopharmaceutical. After oral ingestion of Ensure, gallbladder ejection fraction was determined. At 60 min, normal ejection fraction is greater than 33%.   RADIOPHARMACEUTICALS:  5.38 mCi Tc-73m  Choletec IV   COMPARISON:  01/07/2023   FINDINGS: Prompt uptake and biliary excretion of activity by the liver is seen. Gallbladder activity is visualized, consistent with patency of cystic duct. Biliary activity passes into small bowel, consistent with patent common bile duct.   Calculated gallbladder ejection fraction is 40%. (Normal gallbladder ejection fraction with Ensure is greater than 33%.)   IMPRESSION: Normal hepatobiliary scan and gallbladder ejection fraction     Electronically Signed   By: Judie Petit.  Shick M.D.   On: 01/16/2023 10:59  Within last 24 hrs: No results found.  Assessment    Epigastric pain, associated burping, pyrosis of uncertain primary etiology. Patient Active Problem List   Diagnosis Date Noted   Gastric erythema 12/13/2022   GERD (gastroesophageal reflux disease) 10/10/2022   Ingrown left greater  toenail 07/20/2022   Routine general medical examination at a health care facility 06/08/2022   Allergic rhinitis 05/18/2011    Plan    We discussed the role of cholecystectomy, and not having well-defined indications at this time.  The potential of improvement with removing the gallbladder will be entirely dependent upon the etiology.  We discussed multiple options from tweaking bowel habits, to attempting periods of fasting/cleansing, to avoiding additional allergens i.e. gluten etc.  Not entirely certain these options have been fully explored, both patient and mother reluctant to proceed with surgery in light of less aggressive means attempted.  At present this gentleman appears quite healthy despite the recent weight loss.  Hoping further exploration may identify a source other than the gallbladder at this time.  I will be glad to have him back in the near future for further consideration should no resolution improve his symptomatology.   Face-to-face time spent with the patient and accompanying care providers(if present) was 45 minutes, with more than 50% of the time spent counseling, educating, and coordinating care of the patient.    These notes generated with voice recognition software. I apologize for typographical errors.  Mathew Martin M.D., FACS 02/07/2023, 9:29 AM

## 2023-02-07 ENCOUNTER — Encounter: Payer: Self-pay | Admitting: Surgery

## 2023-02-07 ENCOUNTER — Ambulatory Visit: Payer: No Typology Code available for payment source | Admitting: Surgery

## 2023-02-07 VITALS — BP 122/83 | HR 67 | Temp 98.0°F | Ht 75.0 in | Wt 221.0 lb

## 2023-02-07 DIAGNOSIS — R1013 Epigastric pain: Secondary | ICD-10-CM

## 2023-02-07 DIAGNOSIS — K219 Gastro-esophageal reflux disease without esophagitis: Secondary | ICD-10-CM

## 2023-02-07 NOTE — Patient Instructions (Addendum)
Follow-up with our office as needed.  Please call and ask to speak with a nurse if you develop questions or concerns.  Let us know if surgery is something that you would like to have done.   Advised to pursue a goal of 25 to 30 g of fiber daily.  Made aware that the majority of this may be through natural sources, but advised to be aware of actual consumption and to ensure minimal consumption by daily supplementation.  Various forms of supplements discussed.  Recommended Psyllium husk, that mixes well with applesauce, or the powder which goes down well shaken with chocolate milk.  Strongly advised to consume more fluids to ensure adequate hydration, instructed to watch color of urine to determine adequacy of hydration.  Clarity is pursued in urine output, and bowel activity that correlates to significant meal intake.   We need to avoid deferring having bowel movements, advised to take the time at the first sign of sensation, typically following meals, and in the morning.   Subsequent utilization of MiraLAX may be needed ensure at least daily movement, ideally twice daily bowel movements.  If multiple doses of MiraLAX are necessary utilize them. Never skip a day...  To be regular, we must do the above EVERY day.     Laparoscopic Cholecystectomy Laparoscopic cholecystectomy is surgery to remove the gallbladder. The gallbladder is located in the upper right part of the abdomen, behind the liver. It is a storage sac for bile, which is produced in the liver. Bile aids in the digestion and absorption of fats. Cholecystectomy is often done for inflammation of the gallbladder (cholecystitis). This condition is usually caused by a buildup of gallstones (cholelithiasis) in the gallbladder. Gallstones can block the flow of bile, and that can result in inflammation and pain. In severe cases, emergency surgery may be required. If emergency surgery is not required, you will have time to prepare for the  procedure. Laparoscopic surgery is an alternative to open surgery. Laparoscopic surgery has a shorter recovery time. Your common bile duct may also need to be examined during the procedure. If stones are found in the common bile duct, they may be removed. LET [facility] CARE PROVIDER KNOW ABOUT: Any allergies you have. All medicines you are taking, including vitamins, herbs, eye drops, creams, and over-the-counter medicines. Previous problems you or members of your family have had with the use of anesthetics. Any blood disorders you have. Previous surgeries you have had.  Any medical conditions you have. RISKS AND COMPLICATIONS Generally, this is a safe procedure. However, problems may occur, including: Infection. Bleeding. Allergic reactions to medicines. Damage to other structures or organs. A stone remaining in the common bile duct. A bile leak from the cyst duct that is clipped when your gallbladder is removed. The need to convert to open surgery, which requires a larger incision in the abdomen. This may be necessary if your surgeon thinks that it is not safe to continue with a laparoscopic procedure. BEFORE THE PROCEDURE Ask your health care provider about: Changing or stopping your regular medicines. This is especially important if you are taking diabetes medicines or blood thinners. Taking medicines such as aspirin and ibuprofen. These medicines can thin your blood. Do not take these medicines before your procedure if your health care provider instructs you not to. Follow instructions from your health care provider about eating or drinking restrictions. Let your health care provider know if you develop a cold or an infection before surgery. Plan to have  someone take you home after the procedure. Ask your health care provider how your surgical site will be marked or identified. You may be given antibiotic medicine to help prevent infection. PROCEDURE To reduce your risk of  infection: Your health care team will wash or sanitize their hands. Your skin will be washed with soap. An IV tube may be inserted into one of your veins. You will be given a medicine to make you fall asleep (general anesthetic). A breathing tube will be placed in your mouth. The surgeon will make several small cuts (incisions) in your abdomen. A thin, lighted tube (laparoscope) that has a tiny camera on the end will be inserted through one of the small incisions. The camera on the laparoscope will send a picture to a TV screen (monitor) in the operating room. This will give the surgeon a good view inside your abdomen. A gas will be pumped into your abdomen. This will expand your abdomen to give the surgeon more room to perform the surgery. Other tools that are needed for the procedure will be inserted through the other incisions. The gallbladder will be removed through one of the incisions. After your gallbladder has been removed, the incisions will be closed with stitches (sutures), staples, or skin glue. Your incisions may be covered with a bandage (dressing). The procedure may vary among health care providers and hospitals. AFTER THE PROCEDURE Your blood pressure, heart rate, breathing rate, and blood oxygen level will be monitored often until the medicines you were given have worn off. You will be given medicines as needed to control your pain.   This information is not intended to replace advice given to you by your health care provider. Make sure you discuss any questions you have with your health care provider.   Document Released: 10/15/2005 Document Revised: 07/06/2015 Document Reviewed: 05/27/2013 Elsevier Interactive Patient Education 2016 Elsevier Inc.   Indigestion Indigestion is a feeling of pain, discomfort, burning, or fullness in the upper part of your abdomen. It can come and go. It may occur frequently or rarely. Indigestion tends to occur while you are eating or right  after you have finished eating. It may be worse at night and while bending over or lying down. Indigestion may be a symptom of an underlying digestive condition. Follow these instructions at home: Eating and drinking  Follow an eating plan as recommended by your health care provider. Avoid certain foods and drinks as told by your health care provider. This may include: Chocolate and cocoa. Peppermint and mint flavorings. Garlic and onions. Horseradish. Spicy and acidic foods, including peppers, chili powder, curry powder, vinegar, hot sauces, and barbecue sauce. Citrus fruits, such as oranges, lemons, and limes. Tomato-based foods, such as red sauce, chili, salsa, and pizza with red sauce. Fried and fatty foods, such as donuts, french fries, potato chips, and high-fat dressings. High-fat meats, such as hot dogs and fatty cuts of red and white meats, such as rib eye steak, sausage, ham, and bacon. High-fat dairy items, such as whole milk, butter, and cream cheese. Coffee and tea, with or without caffeine. Drinks that contain alcohol. Energy drinks and sports drinks. Carbonated drinks or sodas. Citrus fruit juices. Eat small, frequent meals instead of large meals. Avoid drinking large amounts of liquid with your meals. Avoid eating meals during the 2-3 hours before bedtime. Avoid lying down right after you eat. Avoid exercise for 2 hours after you eat. Lifestyle     Maintain a healthy weight. Ask your health  care provider what weight is healthy for you. If you need to lose weight, work with your health care provider to do so safely. Exercise for at least 30 minutes on 5 or more days each week, or as told by your health care provider. Avoid exercises that include bending forward. This can make your symptoms worse. Wear loose-fitting clothing. Do not wear anything tight around your waist that causes pressure on your abdomen. Do not use any products that contain nicotine or tobacco. These  products include cigarettes, chewing tobacco, and vaping devices, such as e-cigarettes. These can make symptoms worse. If you need help quitting, ask your health care provider. Raise (elevate) the head of your bed about 6 inches (15 cm) when you sleep. You can use a wedge to do this. Try to reduce your stress, such as with yoga or meditation. If you need help reducing stress, ask your health care provider. General instructions Take over-the-counter and prescription medicines only as told by your health care provider. Do not take aspirin or NSAIDs, such as ibuprofen, unless your health care provider told you to take them. Pay attention to any changes in your symptoms. Keep all follow-up visits. This is important. Contact a health care provider if: You have new symptoms. You have unexplained weight loss. You have difficulty swallowing, or it hurts to swallow. Your symptoms do not improve with treatment. Your symptoms last for more than 2 days. You have a fever. You vomit. Get help right away if: You suddenly have pain in your arms, neck, jaw, teeth, or back. You suddenly feel sweaty, dizzy, or light-headed. You faint. You have chest pain or shortness of breath. You cannot stop vomiting, or you vomit blood. Your stool is bloody or black. You have severe pain in your abdomen. These symptoms may represent a serious problem that is an emergency. Do not wait to see if the symptoms will go away. Get medical help right away. Call your local emergency services (911 in the [place] Do not drive yourself to the hospital. Summary Indigestion is a feeling of pain, discomfort, burning, or fullness in the upper part of your abdomen. It tends to occur while you are eating or right after you have finished eating. Follow an eating plan and other lifestyle changes as told by your health care provider. Take over-the-counter and prescription medicines only as told by your health care provider. Do not take  aspirin or NSAIDs, such as ibuprofen, unless your health care provider told you to do so. Contact your health care provider if your symptoms do not get better or they get worse. Some symptoms may represent a serious problem that is an emergency. Do not wait to see if the symptoms will go away. Get medical help right away. This information is not intended to replace advice given to you by your health care provider. Make sure you discuss any questions you have with your health care provider. Document Revised: 04/20/2020 Document Reviewed: 04/20/2020 Elsevier Patient Education  2023 [employer]

## 2023-02-19 ENCOUNTER — Telehealth: Payer: Self-pay | Admitting: Family Medicine

## 2023-02-19 DIAGNOSIS — K319 Disease of stomach and duodenum, unspecified: Secondary | ICD-10-CM

## 2023-02-19 DIAGNOSIS — R1013 Epigastric pain: Secondary | ICD-10-CM

## 2023-02-19 DIAGNOSIS — K219 Gastro-esophageal reflux disease without esophagitis: Secondary | ICD-10-CM

## 2023-02-19 NOTE — Telephone Encounter (Signed)
Ordinarily for 2nd op I would go with a tertiary care center like [facility], [facility] or [facility]  Do they have a preference?   So sorry to hear that he is not doing better

## 2023-02-19 NOTE — Telephone Encounter (Signed)
Mom would like to know if a referral for a different GI doctor can be sent out for her son?She said that the one he has seen  Dr Renold Genta ran every test and they aren't showing anything.She would just like a second opinion on her sons symptoms.

## 2023-02-19 NOTE — Telephone Encounter (Signed)
Pt's mother (DPR signed) and they do want a 2nd opinion. She doesn't have preference either center is fine. She did say she will get pt to check and see if their insurance covers one over the other. She will make sure she lets referral cor know when they call to schedule appt

## 2023-02-19 NOTE — Telephone Encounter (Signed)
I put the referral in  Please let us know if you don't hear in 1-2 weeks   

## 2023-02-20 NOTE — Telephone Encounter (Signed)
Great. Thank you.

## 2023-02-20 NOTE — Telephone Encounter (Signed)
I will send referral over, starting with [facility].   See referral for updates.

## 2023-02-21 ENCOUNTER — Other Ambulatory Visit: Payer: Self-pay

## 2023-02-21 ENCOUNTER — Emergency Department (HOSPITAL_COMMUNITY)
Admission: EM | Admit: 2023-02-21 | Discharge: 2023-02-21 | Disposition: A | Discharge status: Home / Self Care | Payer: No Typology Code available for payment source | Attending: Emergency Medicine | Admitting: Emergency Medicine

## 2023-02-21 ENCOUNTER — Encounter: Payer: Self-pay | Admitting: *Deleted

## 2023-02-21 ENCOUNTER — Emergency Department (HOSPITAL_COMMUNITY): Payer: No Typology Code available for payment source

## 2023-02-21 ENCOUNTER — Encounter (HOSPITAL_COMMUNITY): Payer: Self-pay

## 2023-02-21 DIAGNOSIS — R0789 Other chest pain: Secondary | ICD-10-CM | POA: Insufficient documentation

## 2023-02-21 LAB — CBC
HCT: 40.2 % (ref 39.0–52.0)
Hemoglobin: 13.9 g/dL (ref 13.0–17.0)
MCH: 29.2 pg (ref 26.0–34.0)
MCHC: 34.6 g/dL (ref 30.0–36.0)
MCV: 84.5 fL (ref 80.0–100.0)
Platelets: 142 10*3/uL — ABNORMAL LOW (ref 150–400)
RBC: 4.76 MIL/uL (ref 4.22–5.81)
RDW: 13 % (ref 11.5–15.5)
WBC: 6.4 10*3/uL (ref 4.0–10.5)
nRBC: 0 % (ref 0.0–0.2)

## 2023-02-21 LAB — BASIC METABOLIC PANEL
Anion gap: 7 (ref 5–15)
BUN: 16 mg/dL (ref 6–20)
CO2: 28 mmol/L (ref 22–32)
Calcium: 9.5 mg/dL (ref 8.9–10.3)
Chloride: 103 mmol/L (ref 98–111)
Creatinine, Ser: 0.94 mg/dL (ref 0.61–1.24)
GFR, Estimated: >60 mL/min (ref >60)
Glucose, Bld: 99 mg/dL (ref 70–99)
Potassium: 4.1 mmol/L (ref 3.5–5.1)
Sodium: 138 mmol/L (ref 135–145)

## 2023-02-21 LAB — HEPATIC FUNCTION PANEL
ALT: 24 U/L (ref 0–44)
AST: 18 U/L (ref 15–41)
Albumin: 4.5 g/dL (ref 3.5–5.0)
Alkaline Phosphatase: 57 U/L (ref 38–126)
Bilirubin, Direct: 0.1 mg/dL (ref 0.0–0.2)
Indirect Bilirubin: 0.5 mg/dL (ref 0.3–0.9)
Total Bilirubin: 0.6 mg/dL (ref 0.3–1.2)
Total Protein: 7.3 g/dL (ref 6.5–8.1)

## 2023-02-21 LAB — LIPASE, BLOOD: Lipase: 35 U/L (ref 11–51)

## 2023-02-21 LAB — TROPONIN I (HIGH SENSITIVITY)
Troponin I (High Sensitivity): <2 ng/L (ref <18)
Troponin I (High Sensitivity): <2 ng/L (ref <18)

## 2023-02-21 MED ORDER — PANTOPRAZOLE SODIUM 40 MG IV SOLR
40.0000 mg | Freq: Once | INTRAVENOUS | Status: AC
  Administered 2023-02-21: 40 mg via INTRAVENOUS
  Filled 2023-02-21: qty 10

## 2023-02-21 MED ORDER — SUCRALFATE 1 G PO TABS: 1.0000 g | ORAL_TABLET | Freq: Three times a day (TID) | ORAL | 1 refills | Status: DC

## 2023-02-21 MED ORDER — SIMETHICONE 80 MG PO CHEW: 80.0000 mg | CHEWABLE_TABLET | Freq: Four times a day (QID) | ORAL | 0 refills | Status: AC | PRN

## 2023-02-21 MED ORDER — IOHEXOL 350 MG/ML SOLN
100.0000 mL | Freq: Once | INTRAVENOUS | Status: AC | PRN
  Administered 2023-02-21: 100 mL via INTRAVENOUS

## 2023-02-21 NOTE — ED Triage Notes (Signed)
Patient feels like his chest is sore all across that moves above his belly button. Chest pain has been going on for months. No nausea or vomiting.

## 2023-02-21 NOTE — ED Provider Notes (Signed)
[place] EMERGENCY DEPARTMENT AT [facility] Provider Note   CSN: [id number] Arrival date & time: 02/21/23  1836     History  Chief Complaint  Patient presents with   Chest Pain    Mathew Martin is a 27 y.o. male.  HPI Patient has been having problems with chest pain intermittently severe for a couple months.  Sometimes pain is so intense that comes up through the center of his chest and radiates off to the left and the back.  Patient has had extensive workup for GI source including EGD with diagnosis of GERD.  He however takes daily medications but still is getting periods of severe pain in the chest.  No vomiting.  No syncopal episode.  No significant shortness of breath.  Patient does not take NSAIDs or drink alcohol.    Home Medications Prior to Admission medications   Medication Sig Start Date End Date Taking? Authorizing Provider  acetaminophen (TYLENOL) 325 MG tablet Take 650 mg by mouth every 6 (six) hours as needed for mild pain (or headaches).   Yes [provider]  omeprazole (PRILOSEC) 40 MG capsule Take 1 capsule (40 mg total) by mouth 2 (two) times daily before a meal. In am at least 30 minutes before eating Patient taking differently: Take 40 mg by mouth daily before breakfast. 11/01/22 02/21/23 Yes Vanga, Loel Dubonnet, MD  simethicone (MYLICON) 80 MG chewable tablet Chew 1 tablet (80 mg total) by mouth every 6 (six) hours as needed for flatulence. 02/21/23  Yes Arby Barrette, MD  Sodium Fluoride (SODIUM FLUORIDE 5000 PPM) 1.1 % PSTE Place 1 application  onto teeth 2 (two) times a week.   Yes [provider]  sucralfate (CARAFATE) 1 g tablet Take 1 tablet (1 g total) by mouth 4 (four) times daily -  with meals and at bedtime. 02/21/23  Yes Arby Barrette, MD  sucralfate (CARAFATE) 1 g tablet Take 1 tablet (1 g total) by mouth in the morning, at noon, and at bedtime. As needed Patient not taking: Reported on 02/07/2023 01/30/23   Prosperi,  Christian H, PA-C      Allergies    Patient has no known allergies.    Review of Systems   Review of Systems  Physical Exam Updated Vital Signs BP (!) 149/84   Pulse (!) 57   Temp 98 F (36.7 C) (Oral)   Resp 18   Ht 6\' 3"  (1.905 m)   Wt 99.8 kg   SpO2 97%   BMI 27.50 kg/m  Physical Exam Constitutional:      Comments: Patient is alert.  Well-nourished well-developed very tall and thin.  HENT:     Mouth/Throat:     Pharynx: Oropharynx is clear.  Eyes:     Extraocular Movements: Extraocular movements intact.  Cardiovascular:     Rate and Rhythm: Normal rate and regular rhythm.  Pulmonary:     Effort: Pulmonary effort is normal.     Breath sounds: Normal breath sounds.  Chest:     Chest wall: No tenderness.  Abdominal:     General: There is no distension.     Palpations: Abdomen is soft.     Tenderness: There is no abdominal tenderness. There is no guarding.  Musculoskeletal:        General: Normal range of motion.     Right lower leg: No edema.     Left lower leg: No edema.  Skin:    General: Skin is warm and dry.  Neurological:     General: No focal deficit present.     Mental Status: He is oriented to person, place, and time.  Psychiatric:        Mood and Affect: Mood normal.     ED Results / Procedures / Treatments   Labs (all labs ordered are listed, but only abnormal results are displayed) Labs Reviewed  CBC - Abnormal; Notable for the following components:      Result Value   Platelets 142 (*)    All other components within normal limits  BASIC METABOLIC PANEL  HEPATIC FUNCTION PANEL  LIPASE, BLOOD  TROPONIN I (HIGH SENSITIVITY)  TROPONIN I (HIGH SENSITIVITY)    EKG EKG Interpretation  Date/Time:  Thursday February 21 2023 18:46:09 EDT Ventricular Rate:  64 PR Interval:  163 QRS Duration: 102 QT Interval:  374 QTC Calculation: 386 R Axis:   53 Text Interpretation: Sinus rhythm no sig change from previous. slight early repolarization  Confirmed by Arby Barrette [phone]) on 02/21/2023 8:19:44 PM  Radiology CT Angio Chest/Abd/Pel for Dissection W and/or W/WO  Result Date: 02/21/2023 CLINICAL DATA:  Chest soreness, pain for months EXAM: CT ANGIOGRAPHY CHEST, ABDOMEN AND PELVIS TECHNIQUE: Non-contrast CT of the chest was initially obtained. Multidetector CT imaging through the chest, abdomen and pelvis was performed using the standard protocol during bolus administration of intravenous contrast. Multiplanar reconstructed images and MIPs were obtained and reviewed to evaluate the vascular anatomy. RADIATION DOSE REDUCTION: This exam was performed according to the departmental dose-optimization program which includes automated exposure control, adjustment of the mA and/or kV according to patient size and/or use of iterative reconstruction technique. CONTRAST:  OMNIPAQUE IOHEXOL 350 MG/ML SOLN COMPARISON:  CT chest, 07/03/2019, CT pelvis, 03/18/2015 FINDINGS: CTA CHEST FINDINGS VASCULAR Aorta: Satisfactory opacification of the aorta. Normal contour and caliber of the thoracic aorta. No evidence of aneurysm, dissection, or other acute aortic pathology. Cardiovascular: No evidence of pulmonary embolism on limited non-tailored examination. Normal heart size. No pericardial effusion. Review of the MIP images confirms the above findings. NON VASCULAR Mediastinum/Nodes: No enlarged mediastinal, hilar, or axillary lymph nodes. Minimal thymic remnant in the anterior mediastinum. Thyroid gland, trachea, and esophagus demonstrate no significant findings. Lungs/Pleura: Lungs are clear. No pleural effusion or pneumothorax. Musculoskeletal: No chest wall abnormality. No acute osseous findings. Review of the MIP images confirms the above findings. CTA ABDOMEN AND PELVIS FINDINGS VASCULAR Normal contour and caliber of the abdominal aorta. No evidence of aneurysm, dissection, or other acute aortic pathology. 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 Review of the MIP images confirms the above findings. NON-VASCULAR Hepatobiliary: No solid liver abnormality is seen. No gallstones, gallbladder wall thickening, or biliary dilatation. Pancreas: Unremarkable. No pancreatic ductal dilatation or surrounding inflammatory changes. Spleen: Normal in size without significant abnormality. Adrenals/Urinary Tract: Adrenal glands are unremarkable. Kidneys are normal, without renal calculi, solid lesion, or hydronephrosis. Bladder is unremarkable. Stomach/Bowel: Stomach is within normal limits. Appendix appears normal. No evidence of bowel wall thickening, distention, or inflammatory changes. Lymphatic: No enlarged abdominal or pelvic lymph nodes. Reproductive: No mass or other significant abnormality. Other: No abdominal wall hernia or abnormality. No ascites. Musculoskeletal: No acute osseous findings. IMPRESSION: 1. Normal contour and caliber of the thoracic and abdominal aorta. No evidence of aneurysm, dissection, or other acute aortic pathology. 2. No CT findings of the chest, abdomen, or pelvis to explain pain. Electronically Signed  By: Jearld Lesch M.D.   On: 02/21/2023 22:05   DG Chest 2 View  Result Date: 02/21/2023 CLINICAL DATA:  Chest pain EXAM: CHEST - 2 VIEW COMPARISON:  X-ray 10/04/2022 FINDINGS: No consolidation, pneumothorax or effusion. No edema. Normal cardiopericardial silhouette. IMPRESSION: No acute cardiopulmonary disease. Electronically Signed   By: Karen Kays M.D.   On: 02/21/2023 19:11    Procedures Procedures    Medications Ordered in ED Medications  pantoprazole (PROTONIX) injection 40 mg (40 mg Intravenous Given 02/21/23 2108)  iohexol (OMNIPAQUE) 350 MG/ML injection 100 mL (100 mLs Intravenous Contrast Given 02/21/23 2139)    ED Course/ Medical Decision Making/ A&P                             Medical Decision Making Amount  and/or Complexity of Data Reviewed Labs: ordered. Radiology: ordered.  Risk OTC drugs. Prescription drug management.   Patient presents with acute and severe chest pain.  He has had several prior episodes and has had diagnostic evaluation for GI source.  However, treatments do not seem to be resolving symptoms.  Patient's pain radiates up into the chest through the center and radiates sometimes to the back and the left.  Patient is very tall and thin but denies any known diagnosis of Marfan syndrome.  Also denies family history of Crohn's or ulcerative colitis.  Pain is predominantly in the chest.  Differential diagnosis includes aortic dissection\ACS\PE\pericarditis\esophageal spasm or GERD.  Given recurrent episodes of pain despite treatment and quality and severity of pain in setting of very tall thin young male have some concern for undiagnosed dissection.  Will proceed with CT dissection study.  Treatment will administer Protonix prior diagnosed history of GERD.  Labs within normal limits.  Troponin less than 2.  Metabolic panel, hepatic function, lipase and CBC.  CT dissection study no acute findings per radiology review.  Patient reassessed.  Vital signs stable.  Clinically well in appearance.  At this time with dissection ruled out and low risk for ACS and negative workup, I feel this most likely continues to represent GI source.  Will make recommendations to add Carafate and Mylicon to current regimen.  Patient is working with PCP and GI to try to get control of symptoms.  All results and planning reviewed with the patient's mother at bedside.        Final Clinical Impression(s) / ED Diagnoses Final diagnoses:  Other chest pain    Rx / DC Orders ED Discharge Orders          Ordered    sucralfate (CARAFATE) 1 g tablet  3 times daily with meals & bedtime        02/21/23 2326    simethicone (MYLICON) 80 MG chewable tablet  Every 6 hours PRN        02/21/23 2326               Arby Barrette, MD 02/22/23 [phone]

## 2023-02-21 NOTE — Discharge Instructions (Addendum)
1.  Continue taking your omeprazole as prescribed.  Take Carafate 4 times a day for the next 5 days as prescribed.  Then take as needed.  Try chewable simethicone for gas. 2.  Follow-up with your doctor soon as possible for recheck.

## 2023-02-22 ENCOUNTER — Other Ambulatory Visit: Payer: Self-pay | Admitting: Gastroenterology

## 2023-02-22 ENCOUNTER — Other Ambulatory Visit: Payer: Self-pay

## 2023-02-22 ENCOUNTER — Telehealth: Payer: Self-pay

## 2023-02-22 DIAGNOSIS — K219 Gastro-esophageal reflux disease without esophagitis: Secondary | ICD-10-CM

## 2023-02-22 MED ORDER — OMEPRAZOLE 40 MG PO CPDR
40.0000 mg | DELAYED_RELEASE_CAPSULE | Freq: Two times a day (BID) | ORAL | 1 refills | Status: DC
Start: 2023-02-22 — End: 2023-02-25

## 2023-02-22 NOTE — Transitions of Care (Post Inpatient/ED Visit) (Signed)
   02/22/2023  Name: Devaughn Savant MRN: [id number] DOB: 27-Jun-1996  Today's TOC FU Call Status: TOC FU Call Complete Date: 02/22/23  Transition Care Management Follow-up Telephone Call Date of Discharge: 02/21/23 Discharge Facility: [facility]) Type of Discharge: Emergency Department Reason for ED Visit: Cardiac Conditions Cardiac Conditions Diagnosis: Chest Pain Persisting How have you been since you were released from the hospital?: Better Any questions or concerns?: No  Items Reviewed: Did you receive and understand the discharge instructions provided?: Yes Medications obtained and verified?: Yes (Medications Reviewed) (Pt is picking up medications today.) Any new allergies since your discharge?: No Dietary orders reviewed?: NA Do you have support at home?: Yes  Home Care and Equipment/Supplies: Were Home Health Services Ordered?: No Any new equipment or medical supplies ordered?: No  Functional Questionnaire: Do you need assistance with bathing/showering or dressing?: No Do you need assistance with meal preparation?: No Do you need assistance with eating?: No Do you have difficulty maintaining continence: No Do you need assistance with getting out of bed/getting out of a chair/moving?: No Do you have difficulty managing or taking your medications?: No  Follow up appointments reviewed: PCP Follow-up appointment confirmed?: No (Pt declined making an appointment. Reported he would call back and make an appointment if he felt like he needed it.) MD Provider Line [phone] Given: No Specialist Hospital Follow-up appointment confirmed?: NA Do you need transportation to your follow-up appointment?: No Do you understand care options if your condition(s) worsen?: Yes-patient verbalized understanding    SIGNATURE Valentino Nose, RN

## 2023-02-22 NOTE — Telephone Encounter (Signed)
Last office visit 11/01/2022 Last refill 11/01/2022 1 refills  Canceled appointment on 02/26/2023

## 2023-02-26 ENCOUNTER — Ambulatory Visit: Payer: No Typology Code available for payment source | Admitting: Gastroenterology

## 2023-03-18 NOTE — Telephone Encounter (Signed)
Pt's mom called stating she spoke to the referred office through [facility] & was first told that the referral wasn't approved then was told the pt should reach out to our office regarding referral. Pt's mom is asking for a status of the referral from [name]? Call back # [phone]

## 2023-03-19 NOTE — Telephone Encounter (Signed)
I was able to get the patient worked in with [facility] in [place] next week.  Patient was made aware via telephone and I also sent  him a mychart message per his request.   He is scheduled with Dr Dorothyann Gibbs on Tue 03/26/2023 at 9:30am  [facility] [phone] [address], [place] [address] Phone: [phone]   Nothing further needed.

## 2023-03-19 NOTE — Telephone Encounter (Signed)
I called and spoke directly to the patient regarding his referral.   I called [facility] and Spoke with Archer Asa, she stated that they did have his referral that we faxed 3 times and they did review it but are not able to see him. She stated that the letter was mailed (not faxed) to Dr Lucretia Roers office on 03/11/2023, I verified they have the correct mailing address, I advised that we have nothing scanned in the chart as of yet. I am not sure if Dr Milinda Antis has a letter on her desk or if we just never received it.  Alvina was not able to tell me the contents of the letter and she tried to connect me with someone to discuss the referral but no one was available to speak with me. I left my name and phone number for a call back regarding this patients referral and to try to figure out why [facility] is declining the referral.   I discussed all this with the patient and asked him if he was opposed to going to [facility] if I am able to get him a sooner appt than 04/16/2023. He stated that he was fine with going to [facility].   I advised that I am waiting on a call back from [facility] but that I am going to go ahead and call [facility] this afternoon to see about getting him scheduled.   Mr Carithers stated that he is doing okay, no change/no worsening symptoms. He was appreciative for the call. He is aware that I will be calling him back today with an update.     I am reaching out to Dr Milinda Antis and her CMA [place] to see if they have a letter in the office from [place].

## 2023-03-21 ENCOUNTER — Emergency Department: Payer: No Typology Code available for payment source

## 2023-03-21 ENCOUNTER — Emergency Department
Admission: EM | Admit: 2023-03-21 | Discharge: 2023-03-21 | Disposition: A | Discharge status: Home / Self Care | Payer: No Typology Code available for payment source | Attending: Emergency Medicine | Admitting: Emergency Medicine

## 2023-03-21 DIAGNOSIS — K2970 Gastritis, unspecified, without bleeding: Secondary | ICD-10-CM | POA: Insufficient documentation

## 2023-03-21 DIAGNOSIS — R079 Chest pain, unspecified: Secondary | ICD-10-CM | POA: Diagnosis present

## 2023-03-21 LAB — BASIC METABOLIC PANEL
Anion gap: 8 (ref 5–15)
BUN: 16 mg/dL (ref 6–20)
CO2: 26 mmol/L (ref 22–32)
Calcium: 9.1 mg/dL (ref 8.9–10.3)
Chloride: 104 mmol/L (ref 98–111)
Creatinine, Ser: 0.86 mg/dL (ref 0.61–1.24)
GFR, Estimated: >60 mL/min (ref >60)
Glucose, Bld: 91 mg/dL (ref 70–99)
Potassium: 3.8 mmol/L (ref 3.5–5.1)
Sodium: 138 mmol/L (ref 135–145)

## 2023-03-21 LAB — CBC
HCT: 40.7 % (ref 39.0–52.0)
Hemoglobin: 13.8 g/dL (ref 13.0–17.0)
MCH: 29.1 pg (ref 26.0–34.0)
MCHC: 33.9 g/dL (ref 30.0–36.0)
MCV: 85.9 fL (ref 80.0–100.0)
Platelets: 149 10*3/uL — ABNORMAL LOW (ref 150–400)
RBC: 4.74 MIL/uL (ref 4.22–5.81)
RDW: 13.3 % (ref 11.5–15.5)
WBC: 6.5 10*3/uL (ref 4.0–10.5)
nRBC: 0 % (ref 0.0–0.2)

## 2023-03-21 LAB — TROPONIN I (HIGH SENSITIVITY): Troponin I (High Sensitivity): <2 ng/L (ref <18)

## 2023-03-21 MED ORDER — METOCLOPRAMIDE HCL 10 MG PO TABS: 10.0000 mg | ORAL_TABLET | Freq: Three times a day (TID) | ORAL | 0 refills | Status: DC

## 2023-03-21 MED ORDER — SUCRALFATE 1 G PO TABS: 1.0000 g | ORAL_TABLET | Freq: Four times a day (QID) | ORAL | 2 refills | Status: DC

## 2023-03-21 NOTE — ED Provider Notes (Signed)
[facility] Provider Note    Event Date/Time   First MD Initiated Contact with Patient 03/21/23 1947     (approximate)   History   Chief Complaint: Chest Pain   HPI  Mathew Martin is a 27 y.o. male with a past history of gastritis who comes ED complaining of left-sided chest pain that began earlier today, lasted for a few hours, and then resolved.  Currently no pain or shortness of breath.  No vomiting or diaphoresis.  Nonradiating.  Pain has been happening intermittently for the past 6 months.  He is seeing gastro, had an upper endoscopy which showed gastritis.  Biopsies were normal.  He has had improvement with omeprazole and Carafate.  Also reports multiple other areas of intermittent pain which are not specifically related to the chest pain.  No exertional symptoms, not pleuritic.  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     Physical Exam   Triage Vital Signs: ED Triage Vitals [03/21/23 1919]  Enc Vitals Group     BP 139/71     Pulse Rate 69     Resp 16     Temp 98.5 F (36.9 C)     Temp Source Oral     SpO2 97 %     Weight 215 lb (97.5 kg)     Height 6\' 3"  (1.905 m)     Head Circumference      Peak Flow      Pain Score 4     Pain Loc      Pain Edu?      Excl. in GC?     Most recent vital signs: Vitals:   03/21/23 1919  BP: 139/71  Pulse: 69  Resp: 16  Temp: 98.5 F (36.9 C)  SpO2: 97%    General: Awake, no distress.  CV:  Good peripheral perfusion.  Rate and rhythm.  No murmurs.  Normal distal pulses Resp:  Normal effort.  Clear to auscultation bilaterally Abd:  No distention.  Soft nontender Other:  Chest wall nontender.  Mild pharyngeal erythema   ED Results / Procedures / Treatments   Labs (all labs ordered are listed, but only abnormal results are displayed) Labs Reviewed  CBC - Abnormal; Notable for the following components:      Result Value   Platelets 149  (*)    All other components within normal limits  BASIC METABOLIC PANEL  TROPONIN I (HIGH SENSITIVITY)     EKG Interpreted by me Normal sinus rhythm rate of 62.  Normal axis, normal intervals.  Normal QRS ST segments and T waves.  No ischemic changes.  No evidence of underlying arrhythmia   RADIOLOGY Chest x-ray interpreted by me, appears normal.  Radiology report reviewed   PROCEDURES:  Procedures   MEDICATIONS ORDERED IN ED: Medications - No data to display   IMPRESSION / MDM / ASSESSMENT AND PLAN / ED COURSE  I reviewed the triage vital signs and the nursing notes.  DDx: GERD/gastritis, palpitations, anxiety, electrolyte abnormality, anemia  Patient's presentation is most consistent with severe exacerbation of chronic illness.  Patient presents with recurrent episode of chest pain which seems to be related to chronic gastritis.  Not able to identify any underlying cause, not an excessive user of NSAIDs or alcohol or steroids.  Not on any medications except for omeprazole and Carafate at this point.  Pain is currently resolved.  Vital signs and exam are assuring  Considering the patient's symptoms, medical history, and physical examination today, I have low suspicion for ACS, PE, TAD, pneumothorax, carditis, mediastinitis, pneumonia, CHF, or sepsis.  Chest x-ray and labs are normal.  Will continue omeprazole and Carafate, will also add Reglan.  He has an appointment with another gastroenterologist in 5 days for another assessment.      FINAL CLINICAL IMPRESSION(S) / ED DIAGNOSES   Final diagnoses:  Nonspecific chest pain  Gastritis without bleeding, unspecified chronicity, unspecified gastritis type     Rx / DC Orders   ED Discharge Orders          Ordered    sucralfate (CARAFATE) 1 g tablet  4 times daily        03/21/23 2027    metoCLOPramide (REGLAN) 10 MG tablet  3 times daily before meals & bedtime        03/21/23 2027             Note:  This  document was prepared using Dragon voice recognition software and may include unintentional dictation errors.   Sharman Cheek, MD 03/21/23 2037

## 2023-03-21 NOTE — ED Triage Notes (Signed)
Pt reports left sided chest pain that began today with pain into left and right arms. Pt reports this pain has been off and on since December. Pt denies sob.

## 2023-03-22 ENCOUNTER — Telehealth: Payer: Self-pay

## 2023-03-22 NOTE — Transitions of Care (Post Inpatient/ED Visit) (Signed)
   03/22/2023  Name: Mathew Martin MRN: [id number] DOB: 08-12-1996  Today's TOC FU Call Status: Today's TOC FU Call Status:: Unsuccessul Call (1st Attempt) Unsuccessful Call (1st Attempt) Date: 03/22/23  Attempted to reach the patient regarding the most recent Inpatient/ED visit.  Follow Up Plan: Additional outreach attempts will be made to reach the patient to complete the Transitions of Care (Post Inpatient/ED visit) call.   Signature Elisha Ponder LPN [facility] AWV Team Direct dial:  [phone]

## 2023-03-26 NOTE — Transitions of Care (Post Inpatient/ED Visit) (Cosign Needed Addendum)
   03/26/2023  Name: Mathew Martin MRN: [id number] DOB: [birth year]  Patient declined to make appointment with Dr. Milinda Antis. Has been seen by gastroenterology today and has follow up in 6 months with them.   Today's TOC FU Call Status: Today's TOC FU Call Status:: Unsuccessful Call (2nd Attempt) Unsuccessful Call (1st Attempt) Date: 03/22/23 [facility] FU Call Complete Date: 03/26/23  Transition Care Management Follow-up Telephone Call Date of Discharge: 03/21/23 Discharge Facility: [facility]) Type of Discharge: Emergency Department Reason for ED Visit: Other: How have you been since you were released from the hospital?: Better Any questions or concerns?: No  Items Reviewed: Did you receive and understand the discharge instructions provided?: Yes Medications obtained,verified, and reconciled?: Yes (Medications Reviewed) Any new allergies since your discharge?: No Dietary orders reviewed?: NA Do you have support at home?: Yes People in Home: parent(s)  Medications Reviewed Today: Medications Reviewed Today     Reviewed by Salvatore Marvel, CPhT (Pharmacy Technician) on 02/21/23 at 2228  Med List Status: Complete   Medication Order Taking? Sig Documenting Provider Last Dose Status Informant  acetaminophen (TYLENOL) 325 MG tablet [id number] Yes Take 650 mg by mouth every 6 (six) hours as needed for mild pain (or headaches). [provider] unk Active Mother  omeprazole (PRILOSEC) 40 MG capsule [id number] Yes Take 1 capsule (40 mg total) by mouth 2 (two) times daily before a meal. In am at least 30 minutes before eating  Patient taking differently: Take 40 mg by mouth daily before breakfast.   Toney Reil, MD 02/21/2023 am Active Mother  Sodium Fluoride (SODIUM FLUORIDE 5000 PPM) 1.1 % PSTE [id number] Yes Place 1 application  onto teeth 2 (two) times a week. [provider] Past Week Active Mother  sucralfate (CARAFATE) 1 g tablet [id number] No  Take 1 tablet (1 g total) by mouth in the morning, at noon, and at bedtime. As needed  Patient not taking: Reported on 02/07/2023   Olene Floss, PA-C Not Taking Active Mother            Home Care and Equipment/Supplies: Were Home Health Services Ordered?: NA Any new equipment or medical supplies ordered?: NA  Functional Questionnaire: Do you need assistance with bathing/showering or dressing?: No Do you need assistance with meal preparation?: No Do you need assistance with eating?: No Do you have difficulty maintaining continence: No Do you need assistance with getting out of bed/getting out of a chair/moving?: No Do you have difficulty managing or taking your medications?: No  Follow up appointments reviewed: PCP Follow-up appointment confirmed?: NA MD Provider Line Number:[phone] Given: Yes Specialist Hospital Follow-up appointment confirmed?: Yes Date of Specialist follow-up appointment?: 03/26/23 Follow-Up Specialty Provider:: GI Doctor Do you need transportation to your follow-up appointment?: No Do you understand care options if your condition(s) worsen?: Yes-patient verbalized understanding    SIGNATURE.Donnamarie Poag, CMA

## 2023-10-25 ENCOUNTER — Ambulatory Visit (INDEPENDENT_AMBULATORY_CARE_PROVIDER_SITE_OTHER): Payer: No Typology Code available for payment source | Admitting: Family Medicine

## 2023-10-25 ENCOUNTER — Encounter: Payer: Self-pay | Admitting: Family Medicine

## 2023-10-25 VITALS — BP 119/65 | HR 91 | Temp 98.1°F | Ht 75.0 in | Wt 236.0 lb

## 2023-10-25 DIAGNOSIS — K219 Gastro-esophageal reflux disease without esophagitis: Secondary | ICD-10-CM

## 2023-10-25 DIAGNOSIS — K5900 Constipation, unspecified: Secondary | ICD-10-CM | POA: Diagnosis not present

## 2023-10-25 DIAGNOSIS — R0683 Snoring: Secondary | ICD-10-CM | POA: Insufficient documentation

## 2023-10-25 DIAGNOSIS — R1013 Epigastric pain: Secondary | ICD-10-CM

## 2023-10-25 DIAGNOSIS — K319 Disease of stomach and duodenum, unspecified: Secondary | ICD-10-CM | POA: Diagnosis not present

## 2023-10-25 DIAGNOSIS — G479 Sleep disorder, unspecified: Secondary | ICD-10-CM | POA: Insufficient documentation

## 2023-10-25 NOTE — Assessment & Plan Note (Signed)
Ongoing  Pt is planning a [facility]oscopy with [facility]- will likely get that in spring   I agree with gi (reviewed last note and studies) that miralax would be helpful  Encouraged to start with one dose daily - then titrate as needed to effect understanding it takes a while to work  Also increase fruit/veg and fluids  Call back and Er precautions noted in detail today

## 2023-10-25 NOTE — Assessment & Plan Note (Signed)
Noted on egd   Symptoms of gastritis and gerd are now improved with ppi Watching diet also

## 2023-10-25 NOTE — Assessment & Plan Note (Signed)
Improved on omeprazole 40 mg daily (was on daily)  Watches diet  Discussed triggers to avoid No longer needs carafate Reviewed last gi note from [facility] Also last EGD

## 2023-10-25 NOTE — Assessment & Plan Note (Signed)
This has improved with bid ppi-now has cut to daily  Omeprazole 40 mg daily  From [facility] at [facility]  His EGD did show some erythema Still has frequent burping- encouraged him to avoid carbonation

## 2023-10-25 NOTE — Assessment & Plan Note (Signed)
Pt snores but denies unrestful sleep / day time somnolence or nodding off  Will watch for this  Given a handout on sleep apnea to review Will continue to watch  Weight loss or change in sleep position may help  Can also ask dentist about an oral appliance

## 2023-10-25 NOTE — Progress Notes (Signed)
Subjective:    Patient ID: Mathew Martin, male    DOB: 1996-06-16, 27 y.o.   MRN: [id number]  HPI  Wt Readings from Last 3 Encounters:  10/25/23 236 lb (107 kg)  03/21/23 215 lb (97.5 kg)  02/21/23 220 lb (99.8 kg)   29.50 kg/m  Vitals:   10/25/23 0949 10/25/23 1030  BP: (!) 156/90 119/65  Pulse: 91   Temp: 98.1 F (36.7 C)   SpO2: 96%    Pt presents with GI complaints  Interested in colonoscopy  Sleep issues   Current GI is at [facility]/ [facility] Dr Shawn Stall Last visit 10/08/23 for abd pain, constipation and sleep issues   Noted EGD - showing redness  Saw surgeon for gallbladder   Noted bm offered some relief  Some constipation/ straining  Elavil for sleep  Went up to 50 mg from 25   Simethicone helps some / gas in chest   Has had cardiac work up for cp   Recommended that he start back on prilosec and elavil Recommended colonoscopy with extended bowel prep  Also daily miralax for constipation   Is back on omeprazole 20 mg  At first twice daily  Now decrease to once daily  Definitely helping  Has not had to take it   Some snoring ? If needs a sleep study  Goes to sleep but still feels tired  Falls asleep ok  Wakes up sometimes 2-3 times but is able to go back to sleep [place] for 8 hours of sleep daily  Some days if he works late- less than that   Is tired during the day  Full time job and farm work    Was booked up completely until April   Appetite is off / afraid to eat certain foods due to abd pain  Avoiding fried food and pizza   Some issues with anxiety   Went to ER for right arm pain    Patient Active Problem List   Diagnosis Date Noted   Constipation 10/25/2023   Sleep disorder 10/25/2023   Snoring 10/25/2023   Abdominal pain, epigastric 02/07/2023   Gastric erythema 12/13/2022   GERD (gastroesophageal reflux disease) 10/10/2022   Ingrown left greater toenail 07/20/2022   Routine general medical examination at a health care  facility 06/08/2022   Allergic rhinitis 05/18/2011   Past Medical History:  Diagnosis Date   Belching    "alot" (03/18/2015)   GERD (gastroesophageal reflux disease)    Past Surgical History:  Procedure Laterality Date   ESOPHAGOGASTRODUODENOSCOPY (EGD) WITH PROPOFOL N/A 12/13/2022   Procedure: ESOPHAGOGASTRODUODENOSCOPY (EGD) WITH PROPOFOL;  Surgeon: Toney Reil, MD;  [facility];  Service: Endoscopy;  Laterality: N/A;   INCISION AND DRAINAGE PERIRECTAL ABSCESS N/A 03/18/2015   Procedure: EUA/Ridge Sigmoidscopy/Drainage of pelvic abscess;  Surgeon: Jimmye Norman, MD;  Location: [facility] OR;  Service: General;  Laterality: N/A;   TRANSRECTAL DRAINAGE OF PELVIC ABSCESS  03/18/2015   Social History   Tobacco Use   Smoking status: Never    Passive exposure: Never   Smokeless tobacco: Never  Vaping Use   Vaping status: Never Used  Substance Use Topics   Alcohol use: No   Drug use: No   Family History  Problem Relation Age of Onset   Diabetes Other        MGGF   Other Other        heart problem- PGGM   No Known Allergies Current Outpatient Medications on File Prior to Visit  Medication Sig Dispense Refill   acetaminophen (TYLENOL) 325 MG tablet Take 650 mg by mouth every 6 (six) hours as needed for mild pain (or headaches).     amitriptyline (ELAVIL) 50 MG tablet Take 50 mg by mouth at bedtime.     simethicone (MYLICON) 80 MG chewable tablet Chew 1 tablet (80 mg total) by mouth every 6 (six) hours as needed for flatulence. 30 tablet 0   omeprazole (PRILOSEC) 40 MG capsule Take 1 capsule (40 mg total) by mouth 2 (two) times daily before a meal. In am at least 30 minutes before eating (Patient taking differently: Take 40 mg by mouth daily. In am at least 30 minutes before eating) 180 capsule 0   No current facility-administered medications on file prior to visit.    Review of Systems  Constitutional:  Positive for appetite change. Negative for activity change,  fatigue, fever and unexpected weight change.  HENT:  Negative for congestion, rhinorrhea, sore throat and trouble swallowing.   Eyes:  Negative for pain, redness, itching and visual disturbance.  Respiratory:  Negative for cough, chest tightness, shortness of breath and wheezing.   Cardiovascular:  Negative for chest pain and palpitations.  Gastrointestinal:  Positive for constipation. Negative for abdominal distention, abdominal pain, anal bleeding, blood in stool, diarrhea, nausea, rectal pain and vomiting.  Endocrine: Negative for cold intolerance, heat intolerance, polydipsia and polyuria.  Genitourinary:  Negative for difficulty urinating, dysuria, frequency and urgency.  Musculoskeletal:  Negative for arthralgias, joint swelling and myalgias.  Skin:  Negative for pallor and rash.  Neurological:  Negative for dizziness, tremors, weakness, numbness and headaches.  Hematological:  Negative for adenopathy. Does not bruise/bleed easily.  Psychiatric/Behavioral:  Positive for sleep disturbance. Negative for decreased concentration, dysphoric mood and suicidal ideas. The patient is nervous/anxious.        Objective:   Physical Exam Constitutional:      General: He is not in acute distress.    Appearance: Normal appearance. He is well-developed. He is not ill-appearing or diaphoretic.     Comments: Over weight  Muscular build   HENT:     Head: Normocephalic and atraumatic.  Eyes:     General: No scleral icterus.    Conjunctiva/sclera: Conjunctivae normal.     Pupils: Pupils are equal, round, and reactive to light.  Cardiovascular:     Rate and Rhythm: Regular rhythm. Tachycardia present.     Heart sounds: Normal heart sounds.  Pulmonary:     Effort: Pulmonary effort is normal. No respiratory distress.     Breath sounds: Normal breath sounds. No wheezing or rales.  Abdominal:     General: Bowel sounds are normal. There is no distension.     Palpations: Abdomen is soft. There is no  mass.     Tenderness: There is no abdominal tenderness. There is no guarding or rebound.  Musculoskeletal:     Cervical back: Normal range of motion and neck supple.  Lymphadenopathy:     Cervical: No cervical adenopathy.  Skin:    General: Skin is warm and dry.     Coloration: Skin is not jaundiced or pale.     Findings: No bruising, erythema or rash.  Neurological:     Mental Status: He is alert.     Coordination: Coordination normal.     Deep Tendon Reflexes: Reflexes normal.  Psychiatric:        Attention and Perception: Attention normal.     Comments: Mildly anxious  Very quiet  Assessment & Plan:   Problem List Items Addressed This Visit       Digestive   GERD (gastroesophageal reflux disease) - Primary   Improved on omeprazole 40 mg daily (was on daily)  Watches diet  Discussed triggers to avoid No longer needs carafate Reviewed last gi note from [facility] Also last EGD       Gastric erythema   Noted on egd   Symptoms of gastritis and gerd are now improved with ppi Watching diet also        Other   Snoring   Pt snores but denies unrestful sleep / day time somnolence or nodding off  Will watch for this  Given a handout on sleep apnea to review Will continue to watch  Weight loss or change in sleep position may help  Can also ask dentist about an oral appliance       Sleep disorder   Amitriptyline is helping 50 mg daily  May help GI problems some as well  May be some anxiety  May follow up to discuss this further        Constipation   Ongoing  Pt is planning a colonoscopy with [facility]- will likely get that in spring   I agree with gi (reviewed last note and studies) that miralax would be helpful  Encouraged to start with one dose daily - then titrate as needed to effect understanding it takes a while to work  Also increase fruit/veg and fluids  Call back and Er precautions noted in detail today         Abdominal pain, epigastric    This has improved with bid ppi-now has cut to daily  Omeprazole 40 mg daily  [facility]  His EGD did show some erythema Still has frequent burping- encouraged him to avoid carbonation

## 2023-10-25 NOTE — Patient Instructions (Addendum)
Call your GI office after the first of the year to get colonoscopy on the books  Ask to be put on cancellation list    Take mirlax every day mixed with drink of choice  It will take 3-4 days to start working  Keep up fluids in general  Try and eat fruits and veggies but avoid what bothers you if necessary  Avoid onions (garlic if needed)   I think a multi vitamin daily is a good idea since the omeprazole can decrease absorption of some vitamins   Watch for day time sleepiness/nodding off and feeling un rested after a full night of sleep

## 2023-10-25 NOTE — Assessment & Plan Note (Signed)
Amitriptyline is helping 50 mg daily  May help GI problems some as well  May be some anxiety  May follow up to discuss this further

## 2024-03-07 ENCOUNTER — Other Ambulatory Visit: Payer: Self-pay | Admitting: Gastroenterology

## 2024-03-07 DIAGNOSIS — K219 Gastro-esophageal reflux disease without esophagitis: Secondary | ICD-10-CM
